# Patient Record
Sex: Female | Born: 1970 | Race: White | Hispanic: No | Marital: Single | Smoking: Current some day smoker
Health system: Southern US, Community
[De-identification: ages and names within clinical notes are randomized; demographics above are authoritative.]

## PROBLEM LIST (undated history)

## (undated) ENCOUNTER — Inpatient Hospital Stay (HOSPITAL_COMMUNITY): Payer: Self-pay

## (undated) DIAGNOSIS — J45909 Unspecified asthma, uncomplicated: Secondary | ICD-10-CM

## (undated) DIAGNOSIS — F429 Obsessive-compulsive disorder, unspecified: Secondary | ICD-10-CM

## (undated) DIAGNOSIS — L709 Acne, unspecified: Secondary | ICD-10-CM

## (undated) DIAGNOSIS — F319 Bipolar disorder, unspecified: Secondary | ICD-10-CM

## (undated) HISTORY — DX: Obsessive-compulsive disorder, unspecified: F42.9

## (undated) HISTORY — DX: Acne, unspecified: L70.9

---

## 1999-08-15 ENCOUNTER — Inpatient Hospital Stay (HOSPITAL_COMMUNITY): Admission: EM | Admit: 1999-08-15 | Discharge: 1999-08-16 | Payer: Self-pay | Admitting: *Deleted

## 2000-06-12 ENCOUNTER — Encounter: Payer: Self-pay | Admitting: Internal Medicine

## 2000-06-12 ENCOUNTER — Ambulatory Visit (HOSPITAL_COMMUNITY): Admission: RE | Admit: 2000-06-12 | Discharge: 2000-06-12 | Payer: Self-pay | Admitting: Internal Medicine

## 2001-03-22 ENCOUNTER — Emergency Department (HOSPITAL_COMMUNITY): Admission: EM | Admit: 2001-03-22 | Discharge: 2001-03-22 | Payer: Self-pay | Admitting: *Deleted

## 2001-08-14 ENCOUNTER — Emergency Department (HOSPITAL_COMMUNITY): Admission: EM | Admit: 2001-08-14 | Discharge: 2001-08-14 | Payer: Self-pay | Admitting: Internal Medicine

## 2001-08-14 ENCOUNTER — Encounter: Payer: Self-pay | Admitting: Internal Medicine

## 2001-08-21 ENCOUNTER — Emergency Department (HOSPITAL_COMMUNITY): Admission: EM | Admit: 2001-08-21 | Discharge: 2001-08-22 | Payer: Self-pay | Admitting: Emergency Medicine

## 2001-10-19 ENCOUNTER — Emergency Department (HOSPITAL_COMMUNITY): Admission: EM | Admit: 2001-10-19 | Discharge: 2001-10-19 | Payer: Self-pay | Admitting: *Deleted

## 2001-10-19 ENCOUNTER — Encounter: Payer: Self-pay | Admitting: *Deleted

## 2001-11-20 ENCOUNTER — Emergency Department (HOSPITAL_COMMUNITY): Admission: EM | Admit: 2001-11-20 | Discharge: 2001-11-20 | Payer: Self-pay | Admitting: Emergency Medicine

## 2003-10-16 ENCOUNTER — Emergency Department (HOSPITAL_COMMUNITY): Admission: EM | Admit: 2003-10-16 | Discharge: 2003-10-16 | Payer: Self-pay | Admitting: Emergency Medicine

## 2004-01-01 ENCOUNTER — Emergency Department (HOSPITAL_COMMUNITY): Admission: EM | Admit: 2004-01-01 | Discharge: 2004-01-01 | Payer: Self-pay | Admitting: Emergency Medicine

## 2004-04-17 ENCOUNTER — Emergency Department (HOSPITAL_COMMUNITY): Admission: EM | Admit: 2004-04-17 | Discharge: 2004-04-17 | Payer: Self-pay | Admitting: Emergency Medicine

## 2004-08-15 ENCOUNTER — Ambulatory Visit: Payer: Self-pay | Admitting: Occupational Therapy

## 2007-05-23 ENCOUNTER — Emergency Department (HOSPITAL_COMMUNITY): Admission: EM | Admit: 2007-05-23 | Discharge: 2007-05-23 | Payer: Self-pay | Admitting: Emergency Medicine

## 2010-05-09 ENCOUNTER — Other Ambulatory Visit (HOSPITAL_COMMUNITY)
Admission: RE | Admit: 2010-05-09 | Discharge: 2010-05-09 | Disposition: A | Discharge status: Home / Self Care | Payer: Medicare Other | Source: Ambulatory Visit | Attending: Obstetrics and Gynecology | Admitting: Obstetrics and Gynecology

## 2010-05-09 ENCOUNTER — Other Ambulatory Visit: Payer: Self-pay | Admitting: Obstetrics and Gynecology

## 2010-05-09 DIAGNOSIS — Z124 Encounter for screening for malignant neoplasm of cervix: Secondary | ICD-10-CM | POA: Insufficient documentation

## 2010-07-13 NOTE — Discharge Summary (Signed)
[facility]  Patient:    Dominique Ferguson, Dominique Ferguson                      MRN: [id number] Adm. Date:  [id number] Disc. Date: [id number] Attending:  Otilio Saber Dictator:   Johnella Moloney, NP                           Discharge Summary  HISTORY OF PRESENT ILLNESS:  Dominique Ferguson is a 40 year old separated white female admitted on voluntary basis from [facility] status post suicidal attempt via overdose on approximately 25-50 Xanax.  Patient states her primary precipitant for this overdose was conflict with her boyfriends wife from whom he is getting a divorce.  She has been experiencing increased job stress.  Until recently, she has been out on Workers Comp and has had to go back to work on Monday.  She was informed at that time she would probably lose her job.  She reports taking approximately 20-50 0.5 mg Xanax in an impulsive gesture to end her life.  She is still verbalizing suicidal ideation but is able to contract for safety.  Reported decreased sleep of approximately 3-4 hours a night for safety.  She has decreased appetite with approximately 6 pound weight loss in one week.  Has no energy and decreased concentration, also has a history of panic attacks and is having approximately two of these a day.  She presents very depressed, but she is as noted contracting for safety. She denies any homicidal ideation or harm towards others.  She experiences mood lability with often angry outbursts.  PAST PSYCHIATRIC HISTORY:  The patient is a client of [facility].  She denies any previous inpatient treatment.  She reports lengthy trials of multiple drugs. She does have a history of auditory and visual hallucinations.  She is not experiencing that at this time.  Reports four other suicidal gestures via overdose and she has made superficial cuts to her arm in the past.  She has last done this on August 14, 1999.  Her therapist is Sammuel Bailiff, but she acknowledges she is noncompliant with most of her [facility] mental health follow up.  PAST MEDICAL HISTORY:  Primary care physician Dr. Olivia Mackie.  Medical problems include asthma and hypertension.  Admission medications:  Xanax 0.5 mg b.i.d. She states she has been on some sort of benzodiazepine since the age of 16. Albuterol metered dose inhaler 2 puffs p.r.n.  She is taking tiamterene/hctz 37.5/25 mg q.d., Celexa 40 mg q.d.  She has been noncompliant with this. Last dose was one week ago.  Birth control pills, and she is been noncompliant with these.  DRUG ALLERGIES:  No known drug allergies.  PHYSICAL EXAMINATION:  Physical examination and clearance was performed at [facility].  No apparent sequelae to overdose.  She was found to be hypokalemic and according to reports received 80 mEq of potassium in two liters of normal saline.  Urine drug screen was positive for benzodiazepines. She had a negative pregnancy test.  She does present with superficial scratches on her left forearm, self inflicted.  Vital signs were stable and have been stable since admission.  MENTAL STATUS EXAMINATION:  On admission, overweight white female who is cooperative.  Speech normal rate and tone and relevant.  Mood is depressed. Affect sad and blunted.  Thought processes:  She is positive for suicide thoughts, contracts for  safety on the unit, denies homicidal ideation. History of obsessive-compulsive traits.  She states she obsessively counts things, need to be in even numbers.  She describes herself as having obsessive thinking, feels like people are frequently talking about her to the point where she has been unable to enter the house, stores and other places. Cognitive function appears to be intact.  Impaired insight and judgment.  No evidence that she is experiencing any psychotic symptoms or attending to internal stimuli.  ADMITTING DIAGNOSES: Axis I:    Major  depression with suicide attempt. Axis II:   Borderline personality disorder, obsessive-compulsive            disorder. Axis III:  Status post overdose on Xanax, asthma, hypertension. Axis IV:   Severe, related to problems with primary support group,            occupational problems. Axis V:    Current global assessment of function 25, highest last year 65.  LABORATORY DATA:  Reported under physical examination.  HOSPITAL COURSE:  The patient was admitted to [facility] unit for treatment of her depression after her overdose.  When she was admitted, it was decided that we would try her on Neurontin 100 mg p.o. q.i.d. and Ambien 10 mg p.o. q.h.s. for sleep.  Also Albuterol MDI 2 puffs q.4-6h. p.r.n.  We decided to change her to Klonopin 0.25 mg p.o. b.i.d.  While at the hospital, she reported feeling better today.  Mood and affect are euthymic. She denies feeling anxious and denied any suicidal thoughts, says she feels able to cope with issues at home.  Sleeping and eating well.  Tolerating the Neurontin.  We did decide to change her to Klonopin to help prevent any withdrawal symptoms from Xanax.  It was decided she could be safely managed on an outpatient basis and that she could be discharged today.  CONDITION ON DISCHARGE:  Patient is discharged in improved condition, with improvement in mood, sleep, appetite, alleviation of suicidal or homicidal ideation, and improvement in energy.  DISPOSITION:  The patient is to be discharged home today.  FOLLOW UP:  She is follow up with the [facility].  DISCHARGE MEDICATIONS: 1. Neurontin 100 mg q.i.d. 2. Klonopin 0.25 mg b.i.d.  FINAL DIAGNOSIS: Axis I:    Depressive disorder not otherwise specified. Axis II:   Borderline personality disorder, obsessive-compulsive            disorder. Axis III:  Status post overdose on Xanax, asthma, hypertension. Axis IV:   Moderate, related to problems with  primary support group. Axis V:    Current global assessment of function 55, highest last year 65. DD:  10/02/99 TD:  10/04/99 Job: [id number]
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
## 2011-06-07 ENCOUNTER — Inpatient Hospital Stay (HOSPITAL_COMMUNITY)
Admission: AD | Admit: 2011-06-07 | Discharge: 2011-06-07 | Disposition: A | Discharge status: Home / Self Care | Payer: Medicare Other | Source: Ambulatory Visit | Attending: Obstetrics and Gynecology | Admitting: Obstetrics and Gynecology

## 2011-06-07 ENCOUNTER — Encounter (HOSPITAL_COMMUNITY): Payer: Self-pay | Admitting: Obstetrics and Gynecology

## 2011-06-07 DIAGNOSIS — Z6791 Unspecified blood type, Rh negative: Secondary | ICD-10-CM

## 2011-06-07 DIAGNOSIS — O26899 Other specified pregnancy related conditions, unspecified trimester: Secondary | ICD-10-CM

## 2011-06-07 DIAGNOSIS — O009 Unspecified ectopic pregnancy without intrauterine pregnancy: Secondary | ICD-10-CM

## 2011-06-07 DIAGNOSIS — O00109 Unspecified tubal pregnancy without intrauterine pregnancy: Secondary | ICD-10-CM | POA: Insufficient documentation

## 2011-06-07 HISTORY — DX: Obsessive-compulsive disorder, unspecified: F42.9

## 2011-06-07 HISTORY — DX: Bipolar disorder, unspecified: F31.9

## 2011-06-07 LAB — HCG, QUANTITATIVE, PREGNANCY: hCG, Beta Chain, Quant, S: 22 m[IU]/mL — ABNORMAL HIGH (ref <5)

## 2011-06-07 LAB — CREATININE, SERUM
Creatinine, Ser: 0.69 mg/dL (ref 0.50–1.10)
GFR calc Af Amer: >90 mL/min (ref >90)
GFR calc non Af Amer: >90 mL/min (ref >90)

## 2011-06-07 LAB — CBC
HCT: 36.3 % (ref 36.0–46.0)
Hemoglobin: 12.2 g/dL (ref 12.0–15.0)
MCHC: 33.6 g/dL (ref 30.0–36.0)
RBC: 4.25 MIL/uL (ref 3.87–5.11)

## 2011-06-07 LAB — AST: AST: 15 U/L (ref 0–37)

## 2011-06-07 LAB — ABO/RH: ABO/RH(D): A NEG

## 2011-06-07 LAB — BUN: BUN: 15 mg/dL (ref 6–23)

## 2011-06-07 MED ORDER — METHOTREXATE INJECTION FOR WOMEN'S HOSPITAL
50.0000 mg/m2 | Freq: Once | INTRAMUSCULAR | Status: AC
  Administered 2011-06-07: 100 mg via INTRAMUSCULAR
  Filled 2011-06-07: qty 2

## 2011-06-07 MED ORDER — RHO D IMMUNE GLOBULIN 1500 UNIT/2ML IJ SOLN
300.0000 ug | Freq: Once | INTRAMUSCULAR | Status: AC
  Administered 2011-06-07: 300 ug via INTRAMUSCULAR

## 2011-06-07 NOTE — MAU Provider Note (Signed)
Dominique Ferguson is a 41 yr old G5 P2022 sent to [facility] MAU for Methotrexate therapy for chronic Ectopic.  She has been followed at [facility] during the current pregnancy which has been notable for atypical qHCG's and non-diagnostic ultrasound. QHCG's have been as follows: 2/27       106    At [facility] 3/8           36.5 3/11         22.5 3/18         75.3 3.25         111.0 3.27           64.3 4/4             51.8 4/9             36.7 Ultrasound 3/27 was non-diagnostic of gestational site, and when seen yesterday in [facility], an ultrasound was again non-diagnostic with an empty uterus with thin symmetric endometrium 4.8 MM, no adnexal masses, but significant tenderness on the right with vag probe, no free fluid.   Blood type is A Negative by [employer]. Rhogam/ Rhophylac has not been administered.   Patient is referred for Methotrexate therapy.  Gyn History significant for Consideration of Tubal sterilization in 2012, with patient deciding against surgery due to concerns over surgery procedure/anesthesia.   Baseline history completed to aid in MAU care.  Patient care continued by [place] CNM

## 2011-06-07 NOTE — MAU Note (Signed)
Patient was given handout on methotrexate and ectopic pregnancy.

## 2011-06-07 NOTE — Discharge Instructions (Signed)
Ectopic Pregnancy An ectopic pregnancy is when the fertilized egg attaches (implants) outside the uterus. Most ectopic pregnancies occur in the fallopian tube. Rarely do ectopic pregnancies occur on the ovary, intestine, pelvis, or cervix. An ectopic pregnancy does not have the ability to develop into a normal, healthy baby.  A ruptured ectopic pregnancy is one in which the fallopian tube gets torn or bursts and results in internal bleeding. Often there is intense abdominal pain, and sometimes, vaginal bleeding. Having an ectopic pregnancy can be a life-threatening experience. If left untreated, this dangerous condition can lead to a blood transfusion, abdominal surgery, or even death. CAUSES  Damage to the fallopian tubes is the suspected cause in most ectopic pregnancies.  RISK FACTORS Depending on your circumstances, the amount of risk of having an ectopic pregnancy will vary. There are 3 categories that may help you identify whether you are potentially at risk. High Risk  You have gone through infertility treatment.   You have had a previous ectopic pregnancy.   You have had previous tubal surgery.   You have had previous surgery to have the fallopian tubes tied (tubal ligation).   You have tubal problems or diseases.   You have been exposed to DES. DES is a medicine that was used until 1971 and had effects on babies whose mothers took the medicine.   You become pregnant while using an intrauterine device (IUD) for birth control.  Moderate Risk  You have a history of infertility.   You have a history of a sexually transmitted infection (STI).   You have a history of pelvic inflammatory disease (PID).   You have scarring from endometriosis.   You have multiple sexual partners.   You smoke.  Low Risk  You have had previous pelvic surgery.   You use vaginal douching.   You became sexually active before 39 years of age.  SYMPTOMS  An ectopic pregnancy should be suspected  in anyone who has missed a period and has abdominal pain or bleeding.  You may experience normal pregnancy symptoms, such as:   Nausea.   Tiredness.   Breast tenderness.   Symptoms that are not normal include:   Pain with intercourse.   Irregular vaginal bleeding or spotting.   Cramping or pain on one side, or in the lower abdomen.   Fast heartbeat.   Passing out while having a bowel movement.   Symptoms of a ruptured ectopic pregnancy and internal bleeding may include:   Sudden, severe pain in the abdomen and pelvis.   Dizziness or fainting.   Pain in the shoulder area.  DIAGNOSIS  Tests that may be performed include:  A pregnancy test.   An ultrasound.   Testing the specific level of pregnancy hormone in the bloodstream.   Taking a sample of uterus tissue (dilation and curettage, D&C).   Surgery to perform a visual exam of the inside of the abdomen using a lighted tube (laparoscopy).  TREATMENT  An injection of methotrexate medicine may be given. This is given if:  The diagnosis is made early.   The fallopian tube has not ruptured.   You are considered to be a good candidate for the medicine.  Usually, pregnancy hormone blood levels are checked after methotrexate treatment. This is to be sure the medicine is effective. It may take 4 to 6 weeks for the pregnancy to be absorbed (though most pregnancies will be absorbed by 3 weeks). Surgical treatment may be needed. A lighted tube (laparoscope) is   used to remove the tubal pregnancy. If severe internal bleeding occurs, a cut (incision) may be made in the lower abdomen (laparotomy), and the ectopic pregnancy is removed. This stops the bleeding. Part of the fallopian tube, or the whole tube, may be removed as well (salpingectomy). After surgery, pregnancy hormone tests may be done to be sure there is no pregnancy tissue left. You may receive a RhoGAM shot if you are Rh negative and the father is Rh positive, or if you do  not know the Rh type of the father. This is to prevent problems with any future pregnancy. SEEK IMMEDIATE MEDICAL CARE IF:  You have any symptoms of an ectopic pregnancy. This is a medical emergency. Document Released: 03/21/2004 Document Revised: 01/31/2011 Document Reviewed: 10/18/2010 [employer] Patient Information 2012 [employer]  Methotrexate Treatment for an Ectopic Pregnancy An ectopic pregnancy is when the fertilized egg attaches (implants) outside the uterus. Most ectopic pregnancies occur in the fallopian tube. Rarely do ectopic pregnancies occur on the ovary, intestine, pelvis, or cervix. An ectopic pregnancy does not have the ability to develop into a normal, healthy baby. Having an ectopic pregnancy can be a life-threatening experience. However, if the ectopic pregnancy is found early enough, it can be treated with a medicine. This medicine is called methotrexate. Methotrexate works by stopping the pregnancy from growing. It helps the body absorb the pregnancy tissue over a 2 to 6 week period (though most pregnancies will be absorbed by 3 weeks).  If methotrexate is successful, there is a good chance that the fallopian tube may be saved. Regardless of whether the fallopian tube is saved, a mother who has had an ectopic pregnancy is at a much higher risk of having another ectopic occur in future pregnancies. One serious concern is the potential for the fallopian tube to tear (rupture). If it does, emergency surgery is needed to remove the pregnancy, and methotrexate cannot be used. The ideal patient for methotrexate is a person who is:   Not bleeding internally.   Has no severe or persistent abdominal pain.   Is committed to following through with lab tests and appointments until the ectopic has absorbed.   Is healthy and has normal liver and kidney functions on evaluation.  Methotrexate should not be given to women who:  Are breastfeeding.   Have a normal pregnancy  (intrauterine pregnancy).   Have liver, lung, or kidney disease.   Have blood problems.   Are allergic to methotrexate.   Have peptic ulcers.   Have an ectopic pregnancy larger than 1 inches (3.5 cm) or one that has fetal heartbeats. This is a rule that is followed most of the time (relative contraindication).  BEFORE THE TREATMENT Before giving the medicine:  Liver tests, kidney tests, and a complete blood test are performed.   Blood tests are performed to measure the pregnancy hormone levels and to determine the mother's blood type.   If the woman is Rh negative, and the father is Rh positive or his Rh type is not known, a RhoGAM shot is given.  TREATMENT  There are 2 methods that your caregiver may use to prescribe methotrexate. One method involves a single dose or injection of the medicine. Another method involves a series of doses. This method involves several injections.  AFTER THE TREATMENT Blood tests will be taken for several weeks to check the pregnancy hormone levels. The blood tests are performed until there is no more pregnancy hormone detected in the blood. There is still   of the ectopic pregnancy rupturing while using the methotrexate. There are also side effects of methotrexate, which include:   Nausea and vomiting.   Mouth sores.   Diarrhea.   Rash.   Dizziness.   Increased abdominal pain.   Increased vaginal bleeding or spotting.   Pneumonia.   Failed treatment.   Hair loss. This is rare and reversible.  On very rare occasions, the medicine may affect your blood counts, liver, kidney, bone marrow, or hormone levels. If this happens, your caregiver will want to perform further evaluations. Document Released: 02/05/2001 Document Revised: 01/31/2011 Document Reviewed: 10/18/2010 [facility] Patient Information 2012 [place], [place].RhoGAM When you are pregnant, you will have a blood test to find out what blood type you are. When a pregnant women is Rh-negative, it  means that she does not have the Rh factor or antigen (a specific protein in red blood cells). The father of the baby will also be tested for his blood type. If he is Rh-negative also, the baby will be Rh-negative. In this case, no Rh problems will develop during the pregnancy, and RhoGAM will not be needed. If the father is Rh-positive, it is possible that the baby will be Rh-positive, which is incompatible with the mother's Rh-negative blood. In this case, the mother's blood will react as though she is allergic to the baby's blood. Her body will produce antibodies to destroy the baby's red blood cells. This causes anemia, brain damage, and even death of the baby. RhoGAM (anti-Rh, anti-D immunoglobulin) is a vaccine or immunization produced from human plasma. It is given to Rh-negative women who have Rh-positive babies, to protect the babies of future pregnancies. Firstborn infants are usually not affected, because it takes time for the mother's body to develop the antibodies. About 15% of people are Rh-negative. CAUSES  In a normal pregnancy, small numbers of the baby's red blood cells get into the mother's bloodstream. When the baby is Rh-positive, the mother's immune system (body system that fights infections and illness) recognizes that the blood is different from her own. The mother's body tries to fight it off and destroy it, like fighting off an infection or illness. The mother's immune system forms antibodies against the baby's blood. These antibodies are passed through the placenta to the baby, and they begin to destroy the baby's red blood cells. The baby becomes anemic (lacking enough red blood cells). The Rh problem does not usually affect the first baby. If the mother does not get RhoGAM, each of the following pregnancies gets more dangerous, if the future babies are Rh-positive. That is because the mother's immune system develops more antibodies each time. It gets stronger and increases the number  of antibodies against the baby's red blood cells with each future pregnancy. TREATMENT  RhoGAM is a solution containing small amounts of Rh antibodies. It prevents the development of antibodies against Rh-positive blood. It is injected into the mother at around 7 months of pregnancy, if she is Rh-negative. It is injected again within 72 hours after the baby is born, if the baby is Rh-positive. It is also given anytime during the pregnancy, if uterine bleeding develops. RhoGAM does not hurt the baby. It has few and minor side effects, if any, to the mother. A MICRhoGAM (smaller dose) is given in case of a miscarriage, elective abortion, or tubal pregnancy (pregnancy outside the uterus) if it is before 13 weeks of the pregnancy. RhoGAM should be given in the event of:  Miscarriage.   Threatened miscarriage,  if there is bleeding.   Induced abortion.   Tubal (ectopic) pregnancy.   Any bleeding during the pregnancy.   Taking an amniotic fluid sample (amniocentesis).   Blood sampling.   Getting the wrong blood transfusion (positive blood in an Rh-negative woman).   Moving the baby from breech to normal position (external version).   Taking a placenta tissue sample (chorionic villus sampling).   Taking a blood sample from the umbilical cord (cordocentesis).   Mass of cysts in the uterus, instead of a fetus (hydatidiform mole).   Failure to get RhoGAM when necessary.   Pregnancy lasting past the due date, when the last RhoGAM shot was given 12 weeks ago.   Injury (trauma) to the abdomen.  RhoGAM should be given even if the Rh-negative mother has a tubal ligation (female sterilization, "tied tubes"). This is because some women will later decide to have surgery to re-open the tubes, in order to get pregnant again. Or the tubal ligation might not work. RhoGAM is given after the delivery of an Rh-positive baby to an Rh-negative mother. It only protects the baby in the next pregnancy. RhoGAM  must be given after each delivery of an Rh-positive baby born to an Rh-negative mother. RhoGAM cannot help a pregnant Rh-negative mother if she has already been sensitized with Rh-positive antibodies. RhoGAM is not known to transmit hepatitis or other infectious diseases. Your caregiver can discuss the recommendations and uses of RhoGAM with you. You should not receive RhoGAM if you had an allergic reaction to it in the past. Document Released: 08/03/2001 Document Revised: 01/31/2011 Document Reviewed: 02/21/2009 [facility] Patient Information 2012 [place], [place].

## 2011-06-07 NOTE — MAU Provider Note (Signed)
Dominique Ferguson is a 41 yr old G5 P2022 sent to [facility] MAU for Methotrexate therapy for chronic Ectopic.  She has been followed at [facility] during the current pregnancy which has been notable for atypical qHCG's and non-diagnostic ultrasound. QHCG's have been as follows: 2/27       106    At [facility] 3/8           36.5 3/11         22.5 3/18         75.3 3.25         111.0 3.27           64.3 4/4             51.8 4/9             36.7 Ultrasound 3/27 was non-diagnostic of gestational site, and when seen yesterday in [facility], an ultrasound was again non-diagnostic with an empty uterus with thin symmetric endometrium 4.8 MM, no adnexal masses, but significant tenderness on the right with vag probe, no free fluid.   Blood type is A Negative by [employer]. Rhogam/ Rhophylac has not been administered.   Patient is referred for Methotrexate therapy.  Gyn History significant for Consideration of Tubal sterilization in 2012, with patient deciding against surgery due to concerns over surgery procedure/anesthesia.   First contact w/ Patient 06/07/11 @ 1010.   Results for orders placed during the hospital encounter of 06/07/11 (from the past 24 hour(s))  HCG, QUANTITATIVE, PREGNANCY     Status: Abnormal   Collection Time   06/07/11  8:58 AM      Component Value Range   hCG, Beta Chain, Quant, S 22 (*) <5 (mIU/mL)  CBC     Status: Normal   Collection Time   06/07/11  8:58 AM      Component Value Range   WBC 9.0  4.0 - 10.5 (K/uL)   RBC 4.25  3.87 - 5.11 (MIL/uL)   Hemoglobin 12.2  12.0 - 15.0 (g/dL)   HCT 16.1  09.6 - 04.5 (%)   MCV 85.4  78.0 - 100.0 (fL)   MCH 28.7  26.0 - 34.0 (pg)   MCHC 33.6  30.0 - 36.0 (g/dL)   RDW 40.9  81.1 - 91.4 (%)   Platelets 194  150 - 400 (K/uL)  AST     Status: Normal   Collection Time   06/07/11  8:58 AM      Component Value Range   AST 15  0 - 37 (U/L)  BUN     Status: Normal   Collection Time   06/07/11  8:58 AM        Component Value Range   BUN 15  6 - 23 (mg/dL)  CREATININE, SERUM     Status: Normal   Collection Time   06/07/11  8:58 AM      Component Value Range   Creatinine, Ser 0.69  0.50 - 1.10 (mg/dL)   GFR calc non Af Amer >90  >90 (mL/min)   GFR calc Af Amer >90  >90 (mL/min)  ABO/RH     Status: Normal   Collection Time   06/07/11  8:58 AM      Component Value Range   ABO/RH(D) A NEG    RH IG WORKUP     Status: Normal (Preliminary result)   Collection Time   06/07/11  9:00 AM      Component Value  Range   Gestational Age(Wks) 22.3     ABO/RH(D) A NEG     Antibody Screen NEG     Fetal Screen NEG     Unit Number [id number]     Blood Component Type RHIG     Unit division 00     Status of Unit ISSUED     Transfusion Status OK TO TRANSFUSE     Received Rhophylac and MTX  1. Ectopic pregnancy   2. Rh negative state in antepartum period    D/C home Follow-up Information    Follow up with [facility] on 06/10/2011. (or MAU as needed if symptoms worsen)    Contact information:   [address] [place] [address]        Ectopic precautions No Ibuprofen, Folic Acid or PNV  Katrinka Blazing, Marca Gadsby 06/07/2011 11:32 AM

## 2011-06-08 LAB — RH IG WORKUP (INCLUDES ABO/RH)
Antibody Screen: NEGATIVE
Unit division: 0

## 2011-06-08 NOTE — MAU Provider Note (Signed)
I agree with this case management and documentation.

## 2012-07-31 ENCOUNTER — Encounter: Payer: Self-pay | Admitting: *Deleted

## 2012-08-03 ENCOUNTER — Encounter: Payer: Self-pay | Admitting: Obstetrics & Gynecology

## 2012-08-03 ENCOUNTER — Ambulatory Visit (INDEPENDENT_AMBULATORY_CARE_PROVIDER_SITE_OTHER): Payer: Medicare Other | Admitting: Obstetrics & Gynecology

## 2012-08-03 VITALS — BP 130/80 | Ht 63.0 in | Wt 198.0 lb

## 2012-08-03 DIAGNOSIS — Z3049 Encounter for surveillance of other contraceptives: Secondary | ICD-10-CM

## 2012-08-03 DIAGNOSIS — Z3202 Encounter for pregnancy test, result negative: Secondary | ICD-10-CM

## 2012-08-03 LAB — POCT URINE PREGNANCY: Preg Test, Ur: NEGATIVE

## 2012-08-03 MED ORDER — MEDROXYPROGESTERONE ACETATE 150 MG/ML IM SUSP
150.0000 mg | Freq: Once | INTRAMUSCULAR | Status: AC
  Administered 2012-08-03: 150 mg via INTRAMUSCULAR

## 2012-10-27 ENCOUNTER — Ambulatory Visit: Payer: Medicare Other

## 2012-10-28 ENCOUNTER — Encounter: Payer: Self-pay | Admitting: Adult Health

## 2012-10-28 ENCOUNTER — Ambulatory Visit (INDEPENDENT_AMBULATORY_CARE_PROVIDER_SITE_OTHER): Payer: Medicare Other | Admitting: Adult Health

## 2012-10-28 VITALS — BP 118/64 | Ht 63.0 in | Wt 203.0 lb

## 2012-10-28 DIAGNOSIS — Z3202 Encounter for pregnancy test, result negative: Secondary | ICD-10-CM

## 2012-10-28 DIAGNOSIS — Z309 Encounter for contraceptive management, unspecified: Secondary | ICD-10-CM

## 2012-10-28 DIAGNOSIS — Z3049 Encounter for surveillance of other contraceptives: Secondary | ICD-10-CM

## 2012-10-28 LAB — POCT URINE PREGNANCY: Preg Test, Ur: NEGATIVE

## 2012-10-28 MED ORDER — MEDROXYPROGESTERONE ACETATE 150 MG/ML IM SUSP
150.0000 mg | Freq: Once | INTRAMUSCULAR | Status: AC
  Administered 2012-10-28: 150 mg via INTRAMUSCULAR

## 2013-01-20 ENCOUNTER — Ambulatory Visit (INDEPENDENT_AMBULATORY_CARE_PROVIDER_SITE_OTHER): Payer: Medicare Other | Admitting: Obstetrics & Gynecology

## 2013-01-20 ENCOUNTER — Encounter: Payer: Self-pay | Admitting: Obstetrics & Gynecology

## 2013-01-20 VITALS — BP 130/78 | Ht 63.0 in | Wt 206.0 lb

## 2013-01-20 DIAGNOSIS — Z3202 Encounter for pregnancy test, result negative: Secondary | ICD-10-CM

## 2013-01-20 DIAGNOSIS — Z3049 Encounter for surveillance of other contraceptives: Secondary | ICD-10-CM

## 2013-01-20 MED ORDER — MEDROXYPROGESTERONE ACETATE 150 MG/ML IM SUSP
150.0000 mg | Freq: Once | INTRAMUSCULAR | Status: AC
  Administered 2013-01-20: 150 mg via INTRAMUSCULAR

## 2013-01-20 NOTE — Progress Notes (Signed)
Pt here for Depo shot. No complaints at this time. 

## 2013-04-08 ENCOUNTER — Other Ambulatory Visit: Payer: Self-pay | Admitting: Adult Health

## 2013-04-13 ENCOUNTER — Ambulatory Visit: Payer: Medicare Other

## 2013-04-16 ENCOUNTER — Ambulatory Visit (INDEPENDENT_AMBULATORY_CARE_PROVIDER_SITE_OTHER): Payer: Medicare Other | Admitting: Obstetrics & Gynecology

## 2013-04-16 ENCOUNTER — Encounter: Payer: Self-pay | Admitting: Obstetrics & Gynecology

## 2013-04-16 VITALS — BP 134/58 | Ht 63.0 in | Wt 207.0 lb

## 2013-04-16 DIAGNOSIS — Z309 Encounter for contraceptive management, unspecified: Secondary | ICD-10-CM

## 2013-04-16 DIAGNOSIS — Z3202 Encounter for pregnancy test, result negative: Secondary | ICD-10-CM

## 2013-04-16 DIAGNOSIS — Z3049 Encounter for surveillance of other contraceptives: Secondary | ICD-10-CM

## 2013-04-16 LAB — POCT URINE PREGNANCY: PREG TEST UR: NEGATIVE

## 2013-04-16 MED ORDER — MEDROXYPROGESTERONE ACETATE 150 MG/ML IM SUSP
150.0000 mg | Freq: Once | INTRAMUSCULAR | Status: AC
  Administered 2013-04-16: 150 mg via INTRAMUSCULAR

## 2013-04-16 NOTE — Progress Notes (Signed)
Pt here for Depo shot. No complaints at this time. To return in 12 weeks for next shot. JSY 

## 2013-05-20 ENCOUNTER — Encounter: Payer: Self-pay | Admitting: Neurology

## 2013-05-21 ENCOUNTER — Encounter: Payer: Medicare Other | Admitting: Neurology

## 2013-05-21 NOTE — Progress Notes (Deleted)
[facility]    Provider:  Dr Hosie PoissonSumner Referring Provider: Biagio Quintobertson, Anthony T, P* Primary Care Physician:  PROVIDER NOT IN SYSTEM  CC:  Migraine and muscle spasms  HPI:  Dominique Ferguson is a 43 y.o. female here as a referral from Dr. Merilynn Finlandobertson for referral of migraines and muscle spasms  Has chronic history of migraines, currently taking Imitrex 25mg  prn. Migraine frequency has been increasing, currently occuring twice a week. She does get some benefit from the Imitrex but feels it does not completely relieve the migraine. Currently using 8-9 pils a month.   Also notes episodes of muscle spasms which began around 6 months ago and have been getting progressively worse. Describes them as generalized, pain followed by electric shock radiating from original site of the pain. No tingling or weakness  Reviewed notes, labs and imaging from outside physicians, which showed *** Review of Systems: Out of a complete 14 system review, the patient complains of only the following symptoms, and all other reviewed systems are negative. ***  History   Social History  . Marital Status: Single    Spouse Name: N/A    Number of Children: N/A  . Years of Education: N/A   Occupational History  . Not on file.   Social History Main Topics  . Smoking status: Former Smoker    Types: Cigarettes  . Smokeless tobacco: Never Used  . Alcohol Use: No  . Drug Use: No  . Sexual Activity: Yes    Birth Control/ Protection: Injection   Other Topics Concern  . Not on file   Social History Narrative  . No narrative on file    Family History  Problem Relation Age of Onset  . Heart disease Other   . Hypertension Father   . Cancer Other   . Diabetes Other     grandfather  . Seizures Other   . Mental illness Other   . Congestive Heart Failure      grandmother    Past Medical History  Diagnosis Date  . Bipolar 1 disorder   . Obsessive compulsive disorder   . Bipolar 1  disorder   . OCD (obsessive compulsive disorder)   . Acne     Past Surgical History  Procedure Laterality Date  . No past surgeries      Current Outpatient Prescriptions  Medication Sig Dispense Refill  . ALBUTEROL IN Inhale into the lungs as needed.      . clonazePAM (KLONOPIN) 1 MG tablet Take 1 mg by mouth 4 (four) times daily.      . divalproex (DEPAKOTE) 500 MG DR tablet Take 500 mg by mouth 3 (three) times daily. 2 at bedtime.      . Ipratropium-Albuterol (COMBIVENT IN) Inhale into the lungs daily.      . medroxyPROGESTERone (DEPO-PROVERA) 150 MG/ML injection FOR INJECT AT MD OFFICE EVERY 3 MONTHS  1 mL  4  . QUEtiapine (SEROQUEL) 300 MG tablet Take 300 mg by mouth at bedtime. 2 at bedtime.      Marland Kitchen. QUEtiapine (SEROQUEL) 400 MG tablet Take 400 mg by mouth 2 (two) times daily.      . SUMAtriptan Succinate (IMITREX PO) Take by mouth daily as needed.      . tolterodine (DETROL LA) 2 MG 24 hr capsule Take 2 mg by mouth daily.      . trazodone (DESYREL) 300 MG tablet Take 300 mg by mouth 3 (three) times daily.       No  current facility-administered medications for this visit.    Allergies as of 05/21/2013  . (No Known Allergies)    Vitals: There were no vitals taken for this visit. Last Weight:  Wt Readings from Last 1 Encounters:  04/16/13 207 lb (93.895 kg)   Last Height:   Ht Readings from Last 1 Encounters:  04/16/13 5\' 3"  (1.6 m)     Physical exam: Exam: Gen: NAD, conversant Eyes: anicteric sclerae, moist conjunctivae HENT: Atraumatic, oropharynx clear Neck: Trachea midline; supple,  Lungs: CTA, no wheezing, rales, rhonic                          CV: RRR, no MRG Abdomen: Soft, non-tender;  Extremities: No peripheral edema  Skin: Normal temperature, no rash,  Psych: Appropriate affect, pleasant  Neuro: MS: AA&Ox3, appropriately interactive, normal affect   Attention: WORLD backwards  Speech: fluent w/o paraphasic error  Memory: good recent and remote  recall  CN: PERRL, EOMI no nystagmus, no ptosis, sensation intact to LT V1-V3 bilat, face symmetric, no weakness, hearing grossly intact, palate elevates symmetrically, shoulder shrug 5/5 bilat,  tongue protrudes midline, no fasiculations noted.  Motor: normal bulk and tone Strength: 5/5  In all extremities  Coord: rapid alternating and point-to-point (FNF, HTS) movements intact.  Reflexes: symmetrical, bilat downgoing toes  Sens: LT intact in all extremities  Gait: posture, stance, stride and arm-swing normal. Tandem gait intact. Able to walk on heels and toes. Romberg absent.   Assessment:  After physical and neurologic examination, review of laboratory studies, imaging, neurophysiology testing and pre-existing records, assessment will be reviewed on the problem list.  Plan:  Treatment plan and additional workup will be reviewed under Problem List.    Elspeth Cho, DO  [facility] [address] [place], [place] [address]  Phone [phone] Fax [phone]  This encounter was created in error - please disregard.

## 2013-05-22 NOTE — Progress Notes (Signed)
Please disregard. Visit created in error as patient was a no show.

## 2013-06-08 ENCOUNTER — Encounter (INDEPENDENT_AMBULATORY_CARE_PROVIDER_SITE_OTHER): Payer: Self-pay

## 2013-06-08 ENCOUNTER — Encounter: Payer: Self-pay | Admitting: Neurology

## 2013-06-08 ENCOUNTER — Telehealth: Payer: Self-pay | Admitting: Neurology

## 2013-06-08 ENCOUNTER — Ambulatory Visit (INDEPENDENT_AMBULATORY_CARE_PROVIDER_SITE_OTHER): Payer: Medicare Other | Admitting: Neurology

## 2013-06-08 VITALS — BP 148/73 | HR 109 | Ht 64.75 in | Wt 208.0 lb

## 2013-06-08 DIAGNOSIS — R51 Headache: Secondary | ICD-10-CM

## 2013-06-08 DIAGNOSIS — R519 Headache, unspecified: Secondary | ICD-10-CM | POA: Insufficient documentation

## 2013-06-08 MED ORDER — GABAPENTIN 300 MG PO CAPS: ORAL_CAPSULE | ORAL | Status: DC

## 2013-06-08 NOTE — Patient Instructions (Signed)
Overall you are doing fairly well but I do want to suggest a few things today:   Remember to drink plenty of fluid, eat healthy meals and do not skip any meals. Try to eat protein with a every meal and eat a healthy snack such as fruit or nuts in between meals. Try to keep a regular sleep-wake schedule and try to exercise daily, particularly in the form of walking, 20-30 minutes a day, if you can.   As far as your medications are concerned, I would like to suggest the following: 1)Please increase the gabapentin using the following schedule: -week one increase the evening dose to 600mg  -week two, increase the afternoon and evening dose to 600mg  -week three, increase all 3 doses to 600mg  so you are taking 600mg  three times a day  Consider discussing with your psychiatrist if he is ok with slightly decreasing your seroquel dose, this high dose can lead to headaches  Please schedule an EMG/NCS when you check out  We will follow up once your workup is completed. Please call us with any interim questions, concerns, problems, updates or refill requests.   My clinical assistant and will answer any of your questions and relay your messages to me and also relay most of my messages to you.   Our phone number is [phone]. We also have an after hours call service for urgent matters and there is a physician on-call for urgent questions. For any emergencies you know to call 911 or go to the nearest emergency room

## 2013-06-08 NOTE — Progress Notes (Signed)
[facility]    Provider:  Dr Hosie PoissonSumner Referring Provider: Biagio Quintobertson, Anthony T, P* Primary Care Physician:  Lucius ConnOBERTSON, ANTHONY T, PA-C  CC:  Migraine headache and muscle spasms  HPI:  Dominique Ferguson is a 43 y.o. female here as a referral from Dr. Merilynn Finlandobertson for migraines and muscle spasms/diffuse pain.  She notes a diffuse chronic pain starting around 5 to 6 months ago. Notes a sharp stabbing pain located in one part of her body (varies location) and then will "shoot an electric shock" to the rest of her body, will happen repetitively to the rest of her body. Can last 30s to a few minutes. Can happen on its own or can be triggered by touch. Gets subjective weakness of her whole body, this occurs separate of those events. Feels the RUE is very weak from the elbow down, notes difficulty gripping things or opening bottles with her right hand.   Reports having headaches since age 89. Recently have progressed, currently occuring 2 to 3 times a week. Recently started on Imitrex by PCP, gives minimal benefit. Headaches can last hours to all day. Headache is typically right sided periorbital. Gets associated nausea and vomiting, + blurry vision.   Currently taking depakote and seroquel 400mg  during the day and 600mg  during the night (states she has been on this dose for years). Is followed by a psychiatrist. Her gabapentin was increased around 1 month ago, currently taking 300mg  TID, has given some benefit but has not resolved her symptoms.   Review of Systems: Out of a complete 14 system review, the patient complains of only the following symptoms, and all other reviewed systems are negative. + headache, confusion, snoring, depression, anxiety, too much sleep, weight gain, fatigue  History   Social History  . Marital Status: Single    Spouse Name: N/A    Number of Children: N/A  . Years of Education: N/A   Occupational History  . Not on file.   Social History Main  Topics  . Smoking status: Former Smoker    Types: Cigarettes  . Smokeless tobacco: Never Used  . Alcohol Use: No  . Drug Use: No  . Sexual Activity: Yes    Birth Control/ Protection: Injection   Other Topics Concern  . Not on file   Social History Narrative  . No narrative on file    Family History  Problem Relation Age of Onset  . Heart disease Other   . Hypertension Father   . Cancer Other   . Diabetes Other     grandfather  . Seizures Other   . Mental illness Other   . Congestive Heart Failure      grandmother    Past Medical History  Diagnosis Date  . Bipolar 1 disorder   . Obsessive compulsive disorder   . Bipolar 1 disorder   . OCD (obsessive compulsive disorder)   . Acne     Past Surgical History  Procedure Laterality Date  . No past surgeries      Current Outpatient Prescriptions  Medication Sig Dispense Refill  . traZODone (DESYREL) 100 MG tablet Take 100 mg by mouth at bedtime. Taking 3 pills at bedtime      . ALBUTEROL IN Inhale into the lungs as needed.      . clonazePAM (KLONOPIN) 1 MG tablet Take 1 mg by mouth 4 (four) times daily.      . divalproex (DEPAKOTE) 500 MG DR tablet Take 500 mg by mouth 3 (three)  times daily. 2 at bedtime.      . gabapentin (NEURONTIN) 300 MG capsule       . Ipratropium-Albuterol (COMBIVENT IN) Inhale into the lungs daily.      . medroxyPROGESTERone (DEPO-PROVERA) 150 MG/ML injection FOR INJECT AT MD OFFICE EVERY 3 MONTHS  1 mL  4  . mupirocin ointment (BACTROBAN) 2 %       . oxybutynin (DITROPAN) 5 MG tablet       . PROAIR HFA 108 (90 BASE) MCG/ACT inhaler       . QUEtiapine (SEROQUEL) 300 MG tablet Take 300 mg by mouth at bedtime. 2 at bedtime.      Marland Kitchen QUEtiapine (SEROQUEL) 400 MG tablet Take 400 mg by mouth 2 (two) times daily.      . SUMAtriptan Succinate (IMITREX PO) Take by mouth daily as needed.      . SYMBICORT 80-4.5 MCG/ACT inhaler       . tolterodine (DETROL LA) 2 MG 24 hr capsule Take 2 mg by mouth daily.        No current facility-administered medications for this visit.    Allergies as of 06/08/2013  . (No Known Allergies)    Vitals: There were no vitals taken for this visit. Last Weight:  Wt Readings from Last 1 Encounters:  04/16/13 207 lb (93.895 kg)   Last Height:   Ht Readings from Last 1 Encounters:  04/16/13 5\' 3"  (1.6 m)     Physical exam: Exam: Gen: NAD, conversant Eyes: anicteric sclerae, moist conjunctivae HENT: Atraumatic, oropharynx clear Neck: Trachea midline; supple,  Lungs: CTA, no wheezing, rales, rhonic                          CV: RRR, no MRG Abdomen: Soft, non-tender;  Extremities: No peripheral edema  Skin: Normal temperature, no rash,  Psych: Appropriate affect, pleasant  Neuro: MS: AA&Ox3, appropriately interactive, normal affect   Speech: fluent w/o paraphasic error  Memory: good recent and remote recall  CN: PERRL, EOMI no nystagmus, no ptosis, sensation intact to LT V1-V3 bilat, face symmetric, no weakness, hearing grossly intact, palate elevates symmetrically, shoulder shrug 5/5 bilat,  tongue protrudes midline, no fasiculations noted.  Motor: normal bulk and tone, tenderness to palpation of muscles throughout Strength: 5/5  In all extremities  Coord: rapid alternating and point-to-point (FNF, HTS) movements intact.  Reflexes: symmetrical, bilat downgoing toes  Sens: decreased LT in ulnar distribution from elbow down of RUE  Gait: posture, stance, stride and arm-swing normal.  Able to walk on heels and toes. Romberg absent.   Assessment:  After physical and neurologic examination, review of laboratory studies, imaging, neurophysiology testing and pre-existing records, assessment will be reviewed on the problem list.  Plan:  Treatment plan and additional workup will be reviewed under Problem List.  1)Headache 2)Paresthesias 3)Chronic pain 4)Bipolar disorder  42y/o gentleman presenting for initial evaluation of headache and  diffuse body pain. Headache is likely multifactorial, she does appear to have a history consistent with migraine without aura but I also suspect that the high dose of Seroquel may be contributing to her headache symptoms. Unclear etiology of the diffuse muscle aches. She does have some decreased sensation to LT in ulnar distribution of RUE but otherwise normal exam. Will check EMG/NCS, will then consider lab workup for possible cause. Patients age and symptoms raise possibility of MS but history not fully consistent with this, will hold off on imaging at this point.  Suspect that depression/anxiety may play a role in patients symptoms too.   Elspeth ChoPeter Daryll Spisak, DO  [facility] [address] [place], [place] [address]  Phone [phone] Fax [phone]

## 2013-06-08 NOTE — Telephone Encounter (Signed)
OV notes say: 1)Please increase the gabapentin using the following schedule:  -week one increase the evening dose to 600mg   -week two, increase the afternoon and evening dose to 600mg   -week three, increase all 3 doses to 600mg  so you are taking 600mg  three times a day Rx has been sent.  I called the patient back.  Got no answer.  Left message.

## 2013-06-08 NOTE — Telephone Encounter (Signed)
Patient at check out today states that she will need refill of Gabapentin soon since she said Dr. Hosie PoissonSumner increased her dosage today. Please call and advise patient.

## 2013-06-22 ENCOUNTER — Encounter (INDEPENDENT_AMBULATORY_CARE_PROVIDER_SITE_OTHER): Payer: Self-pay

## 2013-06-22 ENCOUNTER — Ambulatory Visit (INDEPENDENT_AMBULATORY_CARE_PROVIDER_SITE_OTHER): Payer: Medicare Other | Admitting: Neurology

## 2013-06-22 DIAGNOSIS — R209 Unspecified disturbances of skin sensation: Secondary | ICD-10-CM

## 2013-06-22 DIAGNOSIS — Z0289 Encounter for other administrative examinations: Secondary | ICD-10-CM

## 2013-06-22 DIAGNOSIS — M79609 Pain in unspecified limb: Secondary | ICD-10-CM

## 2013-06-22 NOTE — Procedures (Signed)
     HISTORY:  Dominique Ferguson is a 43 year old patient with a history of shocklike sensations that are present in the arms and legs of the last 6 months. She also describes some weakness in the right hand, dropping things out of the hand. Numbness is present in the arms and legs. The patient is being evaluated for possible neuropathy or a cervical radiculopathy.  NERVE CONDUCTION STUDIES:  Nerve conduction studies were performed on both upper extremities. The distal motor latencies and motor amplitudes for the median and ulnar nerves were within normal limits. The F wave latencies and nerve conduction velocities for these nerves were also normal. The sensory latencies for the median and ulnar nerves were normal.  Nerve conduction studies were performed on the right lower extremity. The distal motor latencies and motor amplitudes for the peroneal and posterior tibial nerves were within normal limits. The nerve conduction velocities for these nerves were also normal. The sensory latency for the peroneal nerve was within normal limits.   EMG STUDIES:  EMG study was performed on the right upper extremity:  The first dorsal interosseous muscle reveals 2 to 4 K units with full recruitment. No fibrillations or positive waves were noted. The abductor pollicis brevis muscle reveals 2 to 4 K units with full recruitment. No fibrillations or positive waves were noted. The extensor indicis proprius muscle reveals 1 to 3 K units with full recruitment. No fibrillations or positive waves were noted. The pronator teres muscle reveals 2 to 3 K units with full recruitment. No fibrillations or positive waves were noted. The biceps muscle reveals 1 to 2 K units with full recruitment. No fibrillations or positive waves were noted. The triceps muscle reveals 2 to 4 K units with full recruitment. No fibrillations or positive waves were noted. The anterior deltoid muscle reveals 2 to 3 K units with full recruitment.  No fibrillations or positive waves were noted. The cervical paraspinal muscles were tested at 2 levels. No abnormalities of insertional activity were seen at either level tested. There was good relaxation.  EMG study was performed on the right lower extremity:  The tibialis anterior muscle reveals 2 to 4K motor units with full recruitment. No fibrillations or positive waves were seen. The peroneus tertius muscle reveals 2 to 4K motor units with full recruitment. No fibrillations or positive waves were seen. The medial gastrocnemius muscle reveals 1 to 3K motor units with full recruitment. No fibrillations or positive waves were seen. The vastus lateralis muscle reveals 2 to 4K motor units with full recruitment. No fibrillations or positive waves were seen. The iliopsoas muscle reveals 2 to 4K motor units with full recruitment. No fibrillations or positive waves were seen. The biceps femoris muscle (long head) reveals 2 to 4K motor units with full recruitment. No fibrillations or positive waves were seen. The lumbosacral paraspinal muscles were tested at 3 levels, and revealed no abnormalities of insertional activity at all 3 levels tested. There was good relaxation.   IMPRESSION:  Nerve conduction studies done on both upper extremities and on the right lower extremity were within normal limits. No evidence of a peripheral neuropathy is seen. EMG evaluation of the right upper extremity and the right lower extremity were unremarkable, without evidence of a cervical or a lumbosacral radiculopathy on the right side.  Marlan Palau. Keith Willis MD 06/22/2013 10:57 AM  [facility] [address] [place], [place] [address]  Phone [phone] Fax [phone]

## 2013-07-09 ENCOUNTER — Ambulatory Visit (INDEPENDENT_AMBULATORY_CARE_PROVIDER_SITE_OTHER): Payer: Medicare Other | Admitting: Obstetrics & Gynecology

## 2013-07-09 ENCOUNTER — Encounter: Payer: Self-pay | Admitting: Obstetrics & Gynecology

## 2013-07-09 VITALS — BP 130/60 | Ht 64.0 in | Wt 206.0 lb

## 2013-07-09 DIAGNOSIS — Z3049 Encounter for surveillance of other contraceptives: Secondary | ICD-10-CM

## 2013-07-09 DIAGNOSIS — Z3202 Encounter for pregnancy test, result negative: Secondary | ICD-10-CM

## 2013-07-09 DIAGNOSIS — Z309 Encounter for contraceptive management, unspecified: Secondary | ICD-10-CM

## 2013-07-09 LAB — POCT URINE PREGNANCY: PREG TEST UR: NEGATIVE

## 2013-07-09 MED ORDER — MEDROXYPROGESTERONE ACETATE 150 MG/ML IM SUSP
150.0000 mg | Freq: Once | INTRAMUSCULAR | Status: AC
  Administered 2013-07-09: 150 mg via INTRAMUSCULAR

## 2013-07-09 NOTE — Progress Notes (Signed)
Pt here for Depo shot. Interested in tubal. Advised to schedule an appt with Dr. Despina HiddenEure to discuss. No other concerns at this time. Pt wants tubal before next Depo is due in 12 weeks. JSY

## 2013-07-13 ENCOUNTER — Encounter: Payer: Self-pay | Admitting: Obstetrics & Gynecology

## 2013-07-13 ENCOUNTER — Ambulatory Visit (INDEPENDENT_AMBULATORY_CARE_PROVIDER_SITE_OTHER): Payer: Medicare Other | Admitting: Obstetrics & Gynecology

## 2013-07-13 VITALS — BP 128/70 | Ht 64.0 in | Wt 205.0 lb

## 2013-07-13 DIAGNOSIS — Z309 Encounter for contraceptive management, unspecified: Secondary | ICD-10-CM

## 2013-07-13 DIAGNOSIS — Z302 Encounter for sterilization: Secondary | ICD-10-CM

## 2013-07-13 NOTE — Progress Notes (Signed)
Patient ID: Dominique Ferguson, female   DOB: 08-22-1970, 43 y.o.   MRN: [id number] Preoperative History and Physical  Dominique HonourShannon P Pieczynski is a 43 y.o. [id number] with No LMP recorded. Patient has had an injection. admitted for a laparoscopic bilateral salpingectomy for sterilization.  Discussed options and she chooses salpingectomy  PMH:    Past Medical History  Diagnosis Date  . Bipolar 1 disorder   . Obsessive compulsive disorder   . Bipolar 1 disorder   . OCD (obsessive compulsive disorder)   . Acne     PSH:     Past Surgical History  Procedure Laterality Date  . No past surgeries      POb/GynH:      OB History   Grav Para Term Preterm Abortions TAB SAB Ect Mult Living   5 2 2  2 2    2       SH:   History  Substance Use Topics  . Smoking status: Current Some Day Smoker    Types: Cigarettes  . Smokeless tobacco: Never Used  . Alcohol Use: No    FH:    Family History  Problem Relation Age of Onset  . Heart disease Other   . Cancer Other   . Seizures Other   . Mental illness Other   . Hypertension Father   . Diabetes Other     grandfather  . Congestive Heart Failure      grandmother     Allergies: No Known Allergies  Medications:      Current outpatient prescriptions:ALBUTEROL IN, Inhale into the lungs as needed., Disp: , Rfl: ;  clonazePAM (KLONOPIN) 1 MG tablet, Take 1 mg by mouth 4 (four) times daily., Disp: , Rfl: ;  divalproex (DEPAKOTE) 500 MG DR tablet, Take 500 mg by mouth at bedtime. 2 pills at bedtime, Disp: , Rfl:  gabapentin (NEURONTIN) 300 MG capsule, Week 1: 300mg  am 300mg  afternoon 600mg  night; wk 2: 300mg  am 600mg  afternoon 600mg  night; wk 3: 600mg  three times daily, Disp: 180 capsule, Rfl: 6;  Ipratropium-Albuterol (COMBIVENT IN), Inhale into the lungs daily., Disp: , Rfl: ;  medroxyPROGESTERone (DEPO-PROVERA) 150 MG/ML injection, FOR INJECT AT MD OFFICE EVERY 3 MONTHS, Disp: 1 mL, Rfl: 4;  oxybutynin (DITROPAN) 5 MG tablet, , Disp: , Rfl:  PROAIR  HFA 108 (90 BASE) MCG/ACT inhaler, , Disp: , Rfl: ;  QUEtiapine (SEROQUEL) 300 MG tablet, Take 300 mg by mouth at bedtime. 2 at bedtime., Disp: , Rfl: ;  SUMAtriptan Succinate (IMITREX PO), Take by mouth daily as needed., Disp: , Rfl: ;  SYMBICORT 80-4.5 MCG/ACT inhaler, , Disp: , Rfl: ;  tolterodine (DETROL LA) 2 MG 24 hr capsule, Take 2 mg by mouth daily., Disp: , Rfl:  traZODone (DESYREL) 100 MG tablet, Take 100 mg by mouth at bedtime. Taking 3 pills at bedtime, Disp: , Rfl: ;  mupirocin ointment (BACTROBAN) 2 %, , Disp: , Rfl: ;  QUEtiapine (SEROQUEL) 400 MG tablet, Take 400 mg by mouth 2 (two) times daily., Disp: , Rfl:   Review of Systems:   Review of Systems  Constitutional: Negative for fever, chills, weight loss, malaise/fatigue and diaphoresis.  HENT: Negative for hearing loss, ear pain, nosebleeds, congestion, sore throat, neck pain, tinnitus and ear discharge.   Eyes: Negative for blurred vision, double vision, photophobia, pain, discharge and redness.  Respiratory: Negative for cough, hemoptysis, sputum production, shortness of breath, wheezing and stridor.   Cardiovascular: Negative for chest pain, palpitations, orthopnea, claudication, leg swelling  and PND.  Gastrointestinal: negative for abdominal pain. Negative for heartburn, nausea, vomiting, diarrhea, constipation, blood in stool and melena.  Genitourinary: Negative for dysuria, urgency, frequency, hematuria and flank pain.  Musculoskeletal: Negative for myalgias, back pain, joint pain and falls.  Skin: Negative for itching and rash.  Neurological: Negative for dizziness, tingling, tremors, sensory change, speech change, focal weakness, seizures, loss of consciousness, weakness and headaches.  Endo/Heme/Allergies: Negative for environmental allergies and polydipsia. Does not bruise/bleed easily.  Psychiatric/Behavioral: Negative for depression, suicidal ideas, hallucinations, memory loss and substance abuse. The patient is not  nervous/anxious and does not have insomnia.      PHYSICAL EXAM:  Blood pressure 128/70, height 5\' 4"  (1.626 m), weight 205 lb (92.987 kg).    Vitals reviewed. Constitutional: She is oriented to person, place, and time. She appears well-developed and well-nourished.  HENT:  Head: Normocephalic and atraumatic.  Right Ear: External ear normal.  Left Ear: External ear normal.  Nose: Nose normal.  Mouth/Throat: Oropharynx is clear and moist.  Eyes: Conjunctivae and EOM are normal. Pupils are equal, round, and reactive to light. Right eye exhibits no discharge. Left eye exhibits no discharge. No scleral icterus.  Neck: Normal range of motion. Neck supple. No tracheal deviation present. No thyromegaly present.  Cardiovascular: Normal rate, regular rhythm, normal heart sounds and intact distal pulses.  Exam reveals no gallop and no friction rub.   No murmur heard. Respiratory: Effort normal and breath sounds normal. No respiratory distress. She has no wheezes. She has no rales. She exhibits no tenderness.  GI: Soft. Bowel sounds are normal. She exhibits no distension and no mass. There is tenderness. There is no rebound and no guarding.  Genitourinary:       Vulva is normal without lesions Vagina is pink moist without discharge Cervix normal in appearance and pap is normal Uterus is normal Adnexa is negative  Musculoskeletal: Normal range of motion. She exhibits no edema and no tenderness.  Neurological: She is alert and oriented to person, place, and time. She has normal reflexes. She displays normal reflexes. No cranial nerve deficit. She exhibits normal muscle tone. Coordination normal.  Skin: Skin is warm and dry. No rash noted. No erythema. No pallor.  Psychiatric: She has a normal mood and affect. Her behavior is normal. Judgment and thought content normal.    Labs: Results for orders placed in visit on 07/09/13 (from the past 336 hour(s))  POCT URINE PREGNANCY   Collection Time     07/09/13  9:06 AM      Result Value Ref Range   Preg Test, Ur Negative      EKG: No orders found for this or any previous visit.  Imaging Studies: No results found.    Assessment: Patient Active Problem List   Diagnosis Date Noted  . Contraception management 07/13/2013  . Headache(784.0) 06/08/2013  desires sterilization, specifically desires salpingectomy  Plan: Laparoscopic bilateral salpingectomy for sterilization  Lazaro ArmsLuther H Layn Kye 07/13/2013 9:42 AM

## 2013-08-05 ENCOUNTER — Encounter (HOSPITAL_COMMUNITY): Payer: Self-pay | Admitting: Pharmacy Technician

## 2013-08-10 NOTE — Patient Instructions (Signed)
Dominique HonourShannon P Ferguson  08/10/2013   Your procedure is scheduled on:   08/18/2013  [facility] at  [facility]  AM.  Call this number if you have problems the morning of surgery: [phone]   Remember:   Do not eat food or drink liquids after midnight.   Take these medicines the morning of surgery with A SIP OF WATER:  Seroquel, imitrex, clonazepam, depakote, neurontin, oxybutynin. Take your combivent, symbicort, albuterol before you come.   Do not wear jewelry, make-up or nail polish.  Do not wear lotions, powders, or perfumes.   Do not shave 48 hours prior to surgery. Men may shave face and neck.  Do not bring valuables to the hospital.  [facility]  [facility] any belongings or valuables.               Contacts, dentures or bridgework may not be worn into surgery.  Leave suitcase in the car. After surgery it may be brought to your room.  For patients admitted to the hospital, discharge time is determined by your treatment team.               Patients discharged the day of surgery will not be allowed to drive home.  Name and phone number of your driver: family  Special Instructions: Shower using CHG 2 nights before surgery and the night before surgery.  If you shower the day of surgery use CHG.  Use special wash - you have one bottle of CHG for all showers.  You should use approximately 1/3 of the bottle for each shower.   Please read over the following fact sheets that you were given: Pain Booklet, Coughing and Deep Breathing, Surgical Site Infection Prevention, Anesthesia Post-op Instructions and Care and Recovery After Surgery Sterilization Information, Female Female sterilization is a procedure to permanently prevent pregnancy. There are different ways to perform sterilization, but all either block or close the fallopian tubes so that your eggs cannot reach your uterus. If your egg cannot reach your uterus, sperm cannot fertilize the egg, and you cannot get pregnant.    Sterilization is performed by a surgical procedure. Sometimes these procedures are performed in a hospital while a patient is asleep. Sometimes they can be done in a clinic setting with the patient awake. The fallopian tubes can be surgically cut, tied, or sealed through a procedure called tubal ligation. The fallopian tubes can also be closed with clips or rings. Sterilization can also be done by placing a tiny coil into each fallopian tube, which causes scar tissue to grow inside the tube. The scar tissue then blocks the tubes.  Discuss sterilization with your caregiver to answer any concerns you or your partner may have. You may want to ask what type of sterilization your caregiver performs. Some caregivers may not perform all the various options. Sterilization is permanent and should only be done if you are sure you do not want children or do not want any more children. Having a sterilization reversed may not be successful.  STERILIZATION PROCEDURES  Laparoscopic sterilization. This is a surgical method performed at a time other than right after childbirth. Two incisions are made in the lower abdomen. A thin, lighted tube (laparoscope) is inserted into one of the incisions and is used to perform the procedure. The fallopian tubes are closed with a ring or a clip. An instrument that uses heat could be used to seal the tubes closed (electrocautery).  Mini-laparotomy. This is a surgical method done 1 or 2 days after giving birth. Typically, a small incision is made just below the belly button (umbilicus) and the fallopian tubes are exposed. The tubes can then be sealed, tied, or cut.   Hysteroscopic sterilization. This is performed at a time other than right after childbirth. A tiny, spring-like coil is inserted through the cervix and uterus and placed into the fallopian tubes. The coil causes scaring and blocks the tubes. Other forms of contraception should be used for 3 months after the procedure to  allow the scar tissue to form completely. Additionally, it is required hysterosalpingography be done 3 months later to ensure that the procedure was successful. Hysterosalpingography is a procedure that uses X-rays to look at your uterus and fallopian tubes after a material to make them show up better has been inserted. IS STERILIZATION SAFE? Sterilization is considered safe with very rare complications. Risks depend on the type of procedure you have. As with any surgical procedure, there are risks. Some risks of sterilization by any means include:   Bleeding.  Infection.  Reaction to anesthesia medicine.  Injury to surrounding organs. Risks specific to having hysteroscopic coils placed include:  The coils may not be placed correctly the first time.   The coils may move out of place.   The tubes may not get completely blocked after 3 months.   Injury to surrounding organs when placing the coil.  HOW EFFECTIVE IS FEMALE STERILIZATION? Sterilization is nearly 100% effective, but it can fail. Depending on the type of sterilization, the rate of failure can be as high as 3%. After hysteroscopic sterilization with placement of fallopian tube coils, you will need back-up birth control for 3 months after the procedure. Sterilization is effective for a lifetime.  BENEFITS OF STERILIZATION  It does not affect your hormones, and therefore will not affect your menstrual periods, sexual desire, or performance.   It is effective for a lifetime.   It is safe.   You do not need to worry about getting pregnant. Keep in mind that if you had the hysteroscopic placement procedure, you must wait 3 months after the procedure (or until your caregiver confirms) before pregnancy is not considered possible.   There are no side effects unlike other types of birth control (contraception).  DRAWBACKS OF STERILIZATION  You must be sure you do not want children or any more children. The procedure is  permanent.   It does not provide protection against sexually transmitted infections (STIs).   The tubes can grow back together. If this happens, there is a risk of pregnancy. There is also an increased risk (50%) of pregnancy being an ectopic pregnancy. This is a pregnancy that happens outside of the uterus. Document Released: 07/31/2007 Document Revised: 08/13/2011 Document Reviewed: 05/30/2011 [facility] Patient Information 2014 [employer] [place]. PATIENT INSTRUCTIONS POST-ANESTHESIA  IMMEDIATELY FOLLOWING SURGERY:  Do not drive or operate machinery for the first twenty four hours after surgery.  Do not make any important decisions for twenty four hours after surgery or while taking narcotic pain medications or sedatives.  If you develop intractable nausea and vomiting or a severe headache please notify your doctor immediately.  FOLLOW-UP:  Please make an appointment with your surgeon as instructed. You do not need to follow up with anesthesia unless specifically instructed to do so.  WOUND CARE INSTRUCTIONS (if applicable):  Keep a dry clean dressing on the anesthesia/puncture wound site if there is drainage.  Once the wound has  quit draining you may leave it open to air.  Generally you should leave the bandage intact for twenty four hours unless there is drainage.  If the epidural site drains for more than 36-48 hours please call the anesthesia department.  QUESTIONS?:  Please feel free to call your physician or the hospital operator if you have any questions, and they will be happy to assist you.

## 2013-08-11 ENCOUNTER — Encounter (HOSPITAL_COMMUNITY)
Admission: RE | Admit: 2013-08-11 | Discharge: 2013-08-11 | Disposition: A | Discharge status: Home / Self Care | Payer: Medicare Other | Source: Ambulatory Visit | Attending: Obstetrics & Gynecology | Admitting: Obstetrics & Gynecology

## 2013-08-11 ENCOUNTER — Other Ambulatory Visit: Payer: Self-pay

## 2013-08-11 ENCOUNTER — Encounter (HOSPITAL_COMMUNITY): Payer: Self-pay

## 2013-08-11 DIAGNOSIS — Z01810 Encounter for preprocedural cardiovascular examination: Secondary | ICD-10-CM | POA: Diagnosis present

## 2013-08-11 DIAGNOSIS — Z01812 Encounter for preprocedural laboratory examination: Secondary | ICD-10-CM | POA: Insufficient documentation

## 2013-08-11 HISTORY — DX: Unspecified asthma, uncomplicated: J45.909

## 2013-08-11 LAB — URINALYSIS, ROUTINE W REFLEX MICROSCOPIC
Bilirubin Urine: NEGATIVE
Glucose, UA: NEGATIVE mg/dL
Hgb urine dipstick: NEGATIVE
KETONES UR: NEGATIVE mg/dL
LEUKOCYTES UA: NEGATIVE
Nitrite: NEGATIVE
Protein, ur: NEGATIVE mg/dL
Specific Gravity, Urine: <1.005 — ABNORMAL LOW (ref 1.005–1.030)
UROBILINOGEN UA: 0.2 mg/dL (ref 0.0–1.0)
pH: 6 (ref 5.0–8.0)

## 2013-08-11 LAB — COMPREHENSIVE METABOLIC PANEL
ALT: 18 U/L (ref 0–35)
AST: 16 U/L (ref 0–37)
Albumin: 3.8 g/dL (ref 3.5–5.2)
Alkaline Phosphatase: 63 U/L (ref 39–117)
BILIRUBIN TOTAL: 0.3 mg/dL (ref 0.3–1.2)
BUN: 9 mg/dL (ref 6–23)
CALCIUM: 8.9 mg/dL (ref 8.4–10.5)
CO2: 22 mEq/L (ref 19–32)
Chloride: 105 mEq/L (ref 96–112)
Creatinine, Ser: 0.75 mg/dL (ref 0.50–1.10)
Glucose, Bld: 83 mg/dL (ref 70–99)
Potassium: 4 mEq/L (ref 3.7–5.3)
Sodium: 141 mEq/L (ref 137–147)
Total Protein: 7.1 g/dL (ref 6.0–8.3)

## 2013-08-11 LAB — CBC
HEMATOCRIT: 39.3 % (ref 36.0–46.0)
Hemoglobin: 13.7 g/dL (ref 12.0–15.0)
MCH: 28.6 pg (ref 26.0–34.0)
MCHC: 34.9 g/dL (ref 30.0–36.0)
MCV: 82 fL (ref 78.0–100.0)
Platelets: 196 10*3/uL (ref 150–400)
RBC: 4.79 MIL/uL (ref 3.87–5.11)
RDW: 14 % (ref 11.5–15.5)
WBC: 8.8 10*3/uL (ref 4.0–10.5)

## 2013-08-11 LAB — HCG, QUANTITATIVE, PREGNANCY: hCG, Beta Chain, Quant, S: <1 m[IU]/mL (ref <5)

## 2013-08-18 ENCOUNTER — Encounter (HOSPITAL_COMMUNITY): Payer: Medicare Other | Admitting: Anesthesiology

## 2013-08-18 ENCOUNTER — Ambulatory Visit (HOSPITAL_COMMUNITY)
Admission: RE | Admit: 2013-08-18 | Discharge: 2013-08-18 | Disposition: A | Discharge status: Home / Self Care | Payer: Medicare Other | Source: Ambulatory Visit | Attending: Obstetrics & Gynecology | Admitting: Obstetrics & Gynecology

## 2013-08-18 ENCOUNTER — Ambulatory Visit (HOSPITAL_COMMUNITY): Payer: Medicare Other | Admitting: Anesthesiology

## 2013-08-18 ENCOUNTER — Encounter (HOSPITAL_COMMUNITY): Payer: Self-pay | Admitting: Anesthesiology

## 2013-08-18 ENCOUNTER — Encounter (HOSPITAL_COMMUNITY)
Admission: RE | Disposition: A | Discharge status: Home / Self Care | Payer: Self-pay | Source: Ambulatory Visit | Attending: Obstetrics & Gynecology

## 2013-08-18 ENCOUNTER — Other Ambulatory Visit: Payer: Self-pay | Admitting: *Deleted

## 2013-08-18 DIAGNOSIS — Z9079 Acquired absence of other genital organ(s): Secondary | ICD-10-CM

## 2013-08-18 DIAGNOSIS — Z302 Encounter for sterilization: Secondary | ICD-10-CM | POA: Insufficient documentation

## 2013-08-18 DIAGNOSIS — Z79899 Other long term (current) drug therapy: Secondary | ICD-10-CM | POA: Diagnosis not present

## 2013-08-18 DIAGNOSIS — F319 Bipolar disorder, unspecified: Secondary | ICD-10-CM | POA: Insufficient documentation

## 2013-08-18 HISTORY — PX: LAPAROSCOPIC BILATERAL SALPINGECTOMY: SHX5889

## 2013-08-18 SURGERY — SALPINGECTOMY, BILATERAL, LAPAROSCOPIC
Anesthesia: General | Site: Abdomen | Laterality: Bilateral

## 2013-08-18 MED ORDER — MIDAZOLAM HCL 2 MG/2ML IJ SOLN
INTRAMUSCULAR | Status: AC
  Filled 2013-08-18: qty 2

## 2013-08-18 MED ORDER — PROPOFOL 10 MG/ML IV BOLUS
INTRAVENOUS | Status: AC
  Filled 2013-08-18: qty 20

## 2013-08-18 MED ORDER — ONDANSETRON HCL 8 MG PO TABS: 8.0000 mg | ORAL_TABLET | Freq: Three times a day (TID) | ORAL | Status: AC | PRN

## 2013-08-18 MED ORDER — LACTATED RINGERS IV SOLN
INTRAVENOUS | Status: DC | PRN
  Administered 2013-08-18 (×2): via INTRAVENOUS

## 2013-08-18 MED ORDER — BUPIVACAINE LIPOSOME 1.3 % IJ SUSP
INTRAMUSCULAR | Status: DC | PRN
  Administered 2013-08-18: 20 mL

## 2013-08-18 MED ORDER — KETOROLAC TROMETHAMINE 30 MG/ML IJ SOLN
30.0000 mg | Freq: Once | INTRAMUSCULAR | Status: AC
  Administered 2013-08-18: 30 mg via INTRAVENOUS
  Filled 2013-08-18: qty 1

## 2013-08-18 MED ORDER — NEOSTIGMINE METHYLSULFATE 10 MG/10ML IV SOLN
INTRAVENOUS | Status: DC | PRN
  Administered 2013-08-18: 4 mg via INTRAVENOUS

## 2013-08-18 MED ORDER — OXYCODONE-ACETAMINOPHEN 7.5-325 MG PO TABS: 1.0000 | ORAL_TABLET | Freq: Four times a day (QID) | ORAL | Status: DC | PRN

## 2013-08-18 MED ORDER — FENTANYL CITRATE 0.05 MG/ML IJ SOLN
INTRAMUSCULAR | Status: AC
  Filled 2013-08-18: qty 5

## 2013-08-18 MED ORDER — ONDANSETRON HCL 4 MG/2ML IJ SOLN
4.0000 mg | Freq: Once | INTRAMUSCULAR | Status: AC
  Administered 2013-08-18: 4 mg via INTRAVENOUS

## 2013-08-18 MED ORDER — LACTATED RINGERS IV SOLN
INTRAVENOUS | Status: DC
  Administered 2013-08-18: 09:00:00 via INTRAVENOUS

## 2013-08-18 MED ORDER — GLYCOPYRROLATE 0.2 MG/ML IJ SOLN
INTRAMUSCULAR | Status: AC
  Filled 2013-08-18: qty 1

## 2013-08-18 MED ORDER — CEFAZOLIN SODIUM-DEXTROSE 2-3 GM-% IV SOLR
2.0000 g | INTRAVENOUS | Status: AC
  Administered 2013-08-18: 2 g via INTRAVENOUS
  Filled 2013-08-18: qty 50

## 2013-08-18 MED ORDER — ROCURONIUM BROMIDE 100 MG/10ML IV SOLN
INTRAVENOUS | Status: DC | PRN
  Administered 2013-08-18: 10 mg via INTRAVENOUS
  Administered 2013-08-18: 30 mg via INTRAVENOUS

## 2013-08-18 MED ORDER — ARTIFICIAL TEARS OP OINT
TOPICAL_OINTMENT | OPHTHALMIC | Status: AC
  Filled 2013-08-18: qty 3.5

## 2013-08-18 MED ORDER — FENTANYL CITRATE 0.05 MG/ML IJ SOLN
INTRAMUSCULAR | Status: DC | PRN
  Administered 2013-08-18 (×2): 50 ug via INTRAVENOUS
  Administered 2013-08-18: 100 ug via INTRAVENOUS
  Administered 2013-08-18 (×3): 50 ug via INTRAVENOUS

## 2013-08-18 MED ORDER — 0.9 % SODIUM CHLORIDE (POUR BTL) OPTIME
TOPICAL | Status: DC | PRN
  Administered 2013-08-18: 1000 mL

## 2013-08-18 MED ORDER — KETOROLAC TROMETHAMINE 10 MG PO TABS: 10.0000 mg | ORAL_TABLET | Freq: Three times a day (TID) | ORAL | Status: DC | PRN

## 2013-08-18 MED ORDER — LIDOCAINE HCL (CARDIAC) 20 MG/ML IV SOLN
INTRAVENOUS | Status: DC | PRN
  Administered 2013-08-18: 50 mg via INTRAVENOUS

## 2013-08-18 MED ORDER — ONDANSETRON HCL 4 MG/2ML IJ SOLN
INTRAMUSCULAR | Status: AC
  Filled 2013-08-18: qty 2

## 2013-08-18 MED ORDER — PROPOFOL 10 MG/ML IV BOLUS
INTRAVENOUS | Status: DC | PRN
  Administered 2013-08-18: 150 mg via INTRAVENOUS

## 2013-08-18 MED ORDER — LIDOCAINE HCL (PF) 1 % IJ SOLN
INTRAMUSCULAR | Status: AC
  Filled 2013-08-18: qty 5

## 2013-08-18 MED ORDER — GLYCOPYRROLATE 0.2 MG/ML IJ SOLN
0.2000 mg | Freq: Once | INTRAMUSCULAR | Status: AC
  Administered 2013-08-18: 0.2 mg via INTRAVENOUS

## 2013-08-18 MED ORDER — GLYCOPYRROLATE 0.2 MG/ML IJ SOLN
INTRAMUSCULAR | Status: AC
  Filled 2013-08-18: qty 3

## 2013-08-18 MED ORDER — EPHEDRINE SULFATE 50 MG/ML IJ SOLN
INTRAMUSCULAR | Status: AC
  Filled 2013-08-18: qty 1

## 2013-08-18 MED ORDER — ONDANSETRON HCL 4 MG/2ML IJ SOLN: 4.0000 mg | Freq: Once | INTRAMUSCULAR | Status: DC | PRN

## 2013-08-18 MED ORDER — ROCURONIUM BROMIDE 50 MG/5ML IV SOLN
INTRAVENOUS | Status: AC
  Filled 2013-08-18: qty 1

## 2013-08-18 MED ORDER — SODIUM CHLORIDE 0.9 % IJ SOLN
INTRAMUSCULAR | Status: AC
  Filled 2013-08-18: qty 10

## 2013-08-18 MED ORDER — DEXAMETHASONE SODIUM PHOSPHATE 10 MG/ML IJ SOLN
INTRAMUSCULAR | Status: DC | PRN
  Administered 2013-08-18: 4 mg via INTRAVENOUS

## 2013-08-18 MED ORDER — BUPIVACAINE LIPOSOME 1.3 % IJ SUSP
20.0000 mL | Freq: Once | INTRAMUSCULAR | Status: DC
  Filled 2013-08-18: qty 20

## 2013-08-18 MED ORDER — BUPIVACAINE LIPOSOME 1.3 % IJ SUSP
INTRAMUSCULAR | Status: AC
  Filled 2013-08-18: qty 20

## 2013-08-18 MED ORDER — SODIUM CHLORIDE 0.9 % IJ SOLN
INTRAMUSCULAR | Status: AC
  Filled 2013-08-18: qty 20

## 2013-08-18 MED ORDER — PROMETHAZINE HCL 25 MG PO TABS: 25.0000 mg | ORAL_TABLET | Freq: Four times a day (QID) | ORAL | Status: AC | PRN

## 2013-08-18 MED ORDER — SUCCINYLCHOLINE CHLORIDE 20 MG/ML IJ SOLN
INTRAMUSCULAR | Status: AC
  Filled 2013-08-18: qty 1

## 2013-08-18 MED ORDER — DEXAMETHASONE SODIUM PHOSPHATE 4 MG/ML IJ SOLN
INTRAMUSCULAR | Status: AC
  Filled 2013-08-18: qty 1

## 2013-08-18 MED ORDER — FENTANYL CITRATE 0.05 MG/ML IJ SOLN: 25.0000 ug | INTRAMUSCULAR | Status: DC | PRN

## 2013-08-18 MED ORDER — ARTIFICIAL TEARS OP OINT
TOPICAL_OINTMENT | OPHTHALMIC | Status: DC | PRN
  Administered 2013-08-18: 1 via OPHTHALMIC

## 2013-08-18 MED ORDER — MIDAZOLAM HCL 2 MG/2ML IJ SOLN
1.0000 mg | INTRAMUSCULAR | Status: DC | PRN
  Administered 2013-08-18 (×2): 2 mg via INTRAVENOUS

## 2013-08-18 MED ORDER — DEXAMETHASONE SODIUM PHOSPHATE 4 MG/ML IJ SOLN
4.0000 mg | Freq: Once | INTRAMUSCULAR | Status: AC
  Administered 2013-08-18: 4 mg via INTRAVENOUS

## 2013-08-18 MED ORDER — FENTANYL CITRATE 0.05 MG/ML IJ SOLN
INTRAMUSCULAR | Status: AC
  Filled 2013-08-18: qty 2

## 2013-08-18 MED ORDER — MIDAZOLAM HCL 5 MG/5ML IJ SOLN
INTRAMUSCULAR | Status: DC | PRN
  Administered 2013-08-18: 2 mg via INTRAVENOUS

## 2013-08-18 MED ORDER — GLYCOPYRROLATE 0.2 MG/ML IJ SOLN
INTRAMUSCULAR | Status: DC | PRN
  Administered 2013-08-18: 0.6 mg via INTRAVENOUS

## 2013-08-18 SURGICAL SUPPLY — 54 items
APPLIER CLIP 5 13 M/L LIGAMAX5 (MISCELLANEOUS)
APR CLP MED LRG 5 ANG JAW (MISCELLANEOUS)
BAG HAMPER (MISCELLANEOUS) ×3 IMPLANT
BLADE 11 SAFETY STRL DISP (BLADE) ×1 IMPLANT
CLIP APPLIE 5 13 M/L LIGAMAX5 (MISCELLANEOUS) ×1 IMPLANT
CLOTH BEACON ORANGE TIMEOUT ST (SAFETY) ×3 IMPLANT
COVER LIGHT HANDLE STERIS (MISCELLANEOUS) ×6 IMPLANT
DRAPE PROXIMA HALF (DRAPES) ×1 IMPLANT
ELECT REM PT RETURN 9FT ADLT (ELECTROSURGICAL) ×3
ELECTRODE REM PT RTRN 9FT ADLT (ELECTROSURGICAL) ×1 IMPLANT
FILTER SMOKE EVAC LAPAROSHD (FILTER) ×3 IMPLANT
FORMALIN 10 PREFIL 480ML (MISCELLANEOUS) ×3 IMPLANT
GAUZE SPONGE 4X4 16PLY XRAY LF (GAUZE/BANDAGES/DRESSINGS) IMPLANT
GLOVE BIO SURGEON STRL SZ7 (GLOVE) ×4 IMPLANT
GLOVE BIOGEL PI IND STRL 7.0 (GLOVE) IMPLANT
GLOVE BIOGEL PI IND STRL 8 (GLOVE) ×1 IMPLANT
GLOVE BIOGEL PI INDICATOR 7.0 (GLOVE) ×2
GLOVE BIOGEL PI INDICATOR 8 (GLOVE) ×4
GLOVE ECLIPSE 8.0 STRL XLNG CF (GLOVE) ×3 IMPLANT
GLOVE EXAM NITRILE PF MED BLUE (GLOVE) ×2 IMPLANT
GOWN STRL REUS W/TWL LRG LVL3 (GOWN DISPOSABLE) ×3 IMPLANT
GOWN STRL REUS W/TWL XL LVL3 (GOWN DISPOSABLE) ×3 IMPLANT
INST SET LAPROSCOPIC GYN AP (KITS) ×3 IMPLANT
IV NS IRRIG 3000ML ARTHROMATIC (IV SOLUTION) ×1 IMPLANT
KIT ROOM TURNOVER APOR (KITS) ×3 IMPLANT
MANIFOLD NEPTUNE II (INSTRUMENTS) ×1 IMPLANT
NDL HYPO 18GX1.5 BLUNT FILL (NEEDLE) ×1 IMPLANT
NDL HYPO 21X1.5 SAFETY (NEEDLE) IMPLANT
NDL HYPO 25X1 1.5 SAFETY (NEEDLE) ×1 IMPLANT
NDL INSUFFLATION 14GA 120MM (NEEDLE) ×1 IMPLANT
NEEDLE HYPO 18GX1.5 BLUNT FILL (NEEDLE) ×3 IMPLANT
NEEDLE HYPO 21X1.5 SAFETY (NEEDLE) ×3 IMPLANT
NEEDLE HYPO 25X1 1.5 SAFETY (NEEDLE) IMPLANT
NEEDLE INSUFFLATION 120MM (ENDOMECHANICALS) ×3 IMPLANT
NEEDLE INSUFFLATION 14GA 120MM (NEEDLE) ×3 IMPLANT
PACK PERI GYN (CUSTOM PROCEDURE TRAY) ×3 IMPLANT
PAD ARMBOARD 7.5X6 YLW CONV (MISCELLANEOUS) ×3 IMPLANT
SCALPEL HARMONIC ACE (MISCELLANEOUS) ×3 IMPLANT
SET BASIN LINEN APH (SET/KITS/TRAYS/PACK) ×3 IMPLANT
SET TUBE IRRIG SUCTION NO TIP (IRRIGATION / IRRIGATOR) ×1 IMPLANT
SLEEVE ENDOPATH XCEL 5M (ENDOMECHANICALS) ×3 IMPLANT
SOLUTION ANTI FOG 6CC (MISCELLANEOUS) ×3 IMPLANT
SPONGE GAUZE 2X2 8PLY STER LF (GAUZE/BANDAGES/DRESSINGS) ×3
SPONGE GAUZE 2X2 8PLY STRL LF (GAUZE/BANDAGES/DRESSINGS) ×3 IMPLANT
STAPLER VISISTAT 35W (STAPLE) ×3 IMPLANT
SUT VICRYL 0 UR6 27IN ABS (SUTURE) ×3 IMPLANT
SYR 20CC LL (SYRINGE) ×3 IMPLANT
SYRINGE 10CC LL (SYRINGE) ×3 IMPLANT
TAPE CLOTH SURG 4X10 WHT LF (GAUZE/BANDAGES/DRESSINGS) ×2 IMPLANT
TRAY FOLEY CATH 16FR SILVER (SET/KITS/TRAYS/PACK) ×1 IMPLANT
TROCAR ENDO BLADELESS 11MM (ENDOMECHANICALS) ×3 IMPLANT
TROCAR XCEL NON-BLD 5MMX100MML (ENDOMECHANICALS) ×3 IMPLANT
TUBING INSUF HEATED (TUBING) ×3 IMPLANT
WARMER LAPAROSCOPE (MISCELLANEOUS) ×3 IMPLANT

## 2013-08-18 NOTE — Discharge Instructions (Signed)
Laparoscopic Tubal Ligation °Care After °Refer to this sheet in the next few weeks. These instructions provide you with information on caring for yourself after your procedure. Your caregiver may also give you more specific instructions. Your treatment has been planned according to current medical practices, but problems sometimes occur. Call your caregiver if you have any problems or questions after your procedure. °HOME CARE INSTRUCTIONS  °· Rest the remainder of the day. °· Only take over-the-counter or prescription medicines for pain, discomfort, or fever as directed by your caregiver. Do not take aspirin. It can cause bleeding. °· Gradually resume daily activities, diet, rest, driving, and work. °· Avoid sexual intercourse for 2 weeks or as directed. °· Do not use tampons or douche. °· Do not drive while taking pain medicine. °· Do not lift anything over 5 pounds for 2 weeks or as directed. °· Only take showers, not baths, until you are seen by your caregiver. °· Change bandages (dressings) as directed. °· Take your temperature twice a day and record it. °· Try to have help for the first 7 to 10 days for your household needs. °· Return to your caregiver to get your stitches (sutures) removed and for follow-up visits as directed. °SEEK MEDICAL CARE IF:  °· You have redness, swelling, or increasing pain in a wound. °· You have drainage from a wound lasting longer than 1 day. °· Your pain is getting worse. °· You have a rash. °· You become dizzy or lightheaded. °· You have a reaction to your medicine. °· You need stronger medicine or a change in your pain medicine. °· You notice a bad smell coming from a wound or dressing. °· Your wound breaks open after the sutures have been removed. °· You are constipated. °SEEK IMMEDIATE MEDICAL CARE IF:  °· You faint. °· You have a fever. °· You have increasing abdominal pain. °· You have severe pain in your shoulders. °· You have bleeding or drainage from the suture sites or  vagina following surgery. °· You have shortness of breath or difficulty breathing. °· You have chest or leg pain. °· You have persistent nausea, vomiting, or diarrhea. °MAKE SURE YOU:  °· Understand these instructions. °· Watch your condition. °· Get help right away if you are not doing well or get worse. °Document Released: 08/31/2004 Document Revised: 08/13/2011 Document Reviewed: 05/25/2011 °ExitCare® Patient Information ©2015 [employer]. This information is not intended to replace advice given to you by your health care provider. Make sure you discuss any questions you have with your health care provider. ° °

## 2013-08-18 NOTE — Anesthesia Preprocedure Evaluation (Signed)
Anesthesia Evaluation  Patient identified by MRN, date of birth, ID band Patient awake    Reviewed: Allergy & Precautions, H&P , NPO status , Patient's Chart, lab work & pertinent test results  Airway Mallampati: I TM Distance: >3 FB Neck ROM: Full    Dental  (+) Teeth Intact, Missing, Poor Dentition, Dental Advisory Given   Pulmonary asthma (used all her inhalers this am) , Current Smoker,  breath sounds clear to auscultation        Cardiovascular negative cardio ROS  Rhythm:Regular Rate:Normal     Neuro/Psych  Headaches, PSYCHIATRIC DISORDERS Bipolar Disorder    GI/Hepatic negative GI ROS,   Endo/Other    Renal/GU      Musculoskeletal   Abdominal   Peds  Hematology   Anesthesia Other Findings   Reproductive/Obstetrics                           Anesthesia Physical Anesthesia Plan  ASA: II  Anesthesia Plan: General   Post-op Pain Management:    Induction: Intravenous  Airway Management Planned: Oral ETT  Additional Equipment:   Intra-op Plan:   Post-operative Plan: Extubation in OR  Informed Consent: I have reviewed the patients History and Physical, chart, labs and discussed the procedure including the risks, benefits and alternatives for the proposed anesthesia with the patient or authorized representative who has indicated his/her understanding and acceptance.     Plan Discussed with:   Anesthesia Plan Comments:         Anesthesia Quick Evaluation

## 2013-08-18 NOTE — Op Note (Signed)
Preoperative Diagnosis:  1.  Multiparous female desires permanent sterilization                                          2.  Elects to have bilateral salpingectomy for ovarian cancer  prophylaxis  Postoperative Diagnosis:  Same as above  Procedure:  Laparoscopic Bilateral Salpingectomy  Surgeon:  Rockne CoonsLuther H Eure  Jr MD  Anaesthesia: general  Findings:  Patient had normal pelvic anatomy and no intraperitoneal abnormalities.  Description of Operation:  Patient was taken to the OR and placed into supine position where she underwent general anaesthesia.  She was placed in the dorsal lithotomy position and prepped and draped in the usual sterile fashion.  An incision was made in the umbilicus and dissection taken down to the rectus fascia which was incised and opened.  The non bladed trocar was then placed and the peritoneal cavity was insufflated.  The above noted findings were observed.  Additional trocars were placed in the right and left lower quadrants under direct visualization without difficulty.  The Harmonic scalpel was employed and salpingectomy of both the right and left tubes was performed.   The tubes were removed from the peritoneal cavity and sent to pathology.  There was good hemostasis bilaterally.  The fascia, peritoneum and subcutaneous tissue were closed using 0 vicryl.  The skin was closed using staples.  Exparel 266 mg 20 cc was injected in the 3 incision trocar sites. The patient was awakened from anaesthesia and taken to the PACU with all counts being correct x 3.  The patient received  1 gram of ancef andToradol 30 mg IV preoperatively.  EURE,LUTHER H 08/18/2013 11:45 AM

## 2013-08-18 NOTE — H&P (Signed)
Preoperative History and Physical  Dominique Ferguson is a 43 y.o. [id number] with No LMP recorded. Patient has had an injection. admitted for a laparoscopic bilateral salpingectomy for sterilization.  Discussed options and she chooses salpingectomy  PMH:  Past Medical History   Diagnosis  Date   .  Bipolar 1 disorder    .  Obsessive compulsive disorder    .  Bipolar 1 disorder    .  OCD (obsessive compulsive disorder)    .  Acne    PSH:  Past Surgical History   Procedure  Laterality  Date   .  No past surgeries     POb/GynH:  OB History    Grav  Para  Term  Preterm  Abortions  TAB  SAB  Ect  Mult  Living    5  2  2   2  2     2      SH:  History   Substance Use Topics   .  Smoking status:  Current Some Day Smoker     Types:  Cigarettes   .  Smokeless tobacco:  Never Used   .  Alcohol Use:  No   FH:  Family History   Problem  Relation  Age of Onset   .  Heart disease  Other    .  Cancer  Other    .  Seizures  Other    .  Mental illness  Other    .  Hypertension  Father    .  Diabetes  Other      grandfather   .  Congestive Heart Failure       grandmother   Allergies: No Known Allergies  Medications: Current outpatient prescriptions:ALBUTEROL IN, Inhale into the lungs as needed., Disp: , Rfl: ; clonazePAM (KLONOPIN) 1 MG tablet, Take 1 mg by mouth 4 (four) times daily., Disp: , Rfl: ; divalproex (DEPAKOTE) 500 MG DR tablet, Take 500 mg by mouth at bedtime. 2 pills at bedtime, Disp: , Rfl:  gabapentin (NEURONTIN) 300 MG capsule, Week 1: 300mg  am 300mg  afternoon 600mg  night; wk 2: 300mg  am 600mg  afternoon 600mg  night; wk 3: 600mg  three times daily, Disp: 180 capsule, Rfl: 6; Ipratropium-Albuterol (COMBIVENT IN), Inhale into the lungs daily., Disp: , Rfl: ; medroxyPROGESTERone (DEPO-PROVERA) 150 MG/ML injection, FOR INJECT AT MD OFFICE EVERY 3 MONTHS, Disp: 1 mL, Rfl: 4; oxybutynin (DITROPAN) 5 MG tablet, , Disp: , Rfl:  PROAIR HFA 108 (90 BASE) MCG/ACT inhaler, , Disp: , Rfl: ;  QUEtiapine (SEROQUEL) 300 MG tablet, Take 300 mg by mouth at bedtime. 2 at bedtime., Disp: , Rfl: ; SUMAtriptan Succinate (IMITREX PO), Take by mouth daily as needed., Disp: , Rfl: ; SYMBICORT 80-4.5 MCG/ACT inhaler, , Disp: , Rfl: ; tolterodine (DETROL LA) 2 MG 24 hr capsule, Take 2 mg by mouth daily., Disp: , Rfl:  traZODone (DESYREL) 100 MG tablet, Take 100 mg by mouth at bedtime. Taking 3 pills at bedtime, Disp: , Rfl: ; mupirocin ointment (BACTROBAN) 2 %, , Disp: , Rfl: ; QUEtiapine (SEROQUEL) 400 MG tablet, Take 400 mg by mouth 2 (two) times daily., Disp: , Rfl:  Review of Systems:  Review of Systems  Constitutional: Negative for fever, chills, weight loss, malaise/fatigue and diaphoresis.  HENT: Negative for hearing loss, ear pain, nosebleeds, congestion, sore throat, neck pain, tinnitus and ear discharge.  Eyes: Negative for blurred vision, double vision, photophobia, pain, discharge and redness.  Respiratory: Negative for cough, hemoptysis, sputum production, shortness  of breath, wheezing and stridor.  Cardiovascular: Negative for chest pain, palpitations, orthopnea, claudication, leg swelling and PND.  Gastrointestinal: negative for abdominal pain. Negative for heartburn, nausea, vomiting, diarrhea, constipation, blood in stool and melena.  Genitourinary: Negative for dysuria, urgency, frequency, hematuria and flank pain.  Musculoskeletal: Negative for myalgias, back pain, joint pain and falls.  Skin: Negative for itching and rash.  Neurological: Negative for dizziness, tingling, tremors, sensory change, speech change, focal weakness, seizures, loss of consciousness, weakness and headaches.  Endo/Heme/Allergies: Negative for environmental allergies and polydipsia. Does not bruise/bleed easily.  Psychiatric/Behavioral: Negative for depression, suicidal ideas, hallucinations, memory loss and substance abuse. The patient is not nervous/anxious and does not have insomnia.  PHYSICAL EXAM:   Blood pressure 128/70, height 5\' 4"  (1.626 m), weight 205 lb (92.987 kg).  Vitals reviewed.  Constitutional: She is oriented to person, place, and time. She appears well-developed and well-nourished.  HENT:  Head: Normocephalic and atraumatic.  Right Ear: External ear normal.  Left Ear: External ear normal.  Nose: Nose normal.  Mouth/Throat: Oropharynx is clear and moist.  Eyes: Conjunctivae and EOM are normal. Pupils are equal, round, and reactive to light. Right eye exhibits no discharge. Left eye exhibits no discharge. No scleral icterus.  Neck: Normal range of motion. Neck supple. No tracheal deviation present. No thyromegaly present.  Cardiovascular: Normal rate, regular rhythm, normal heart sounds and intact distal pulses. Exam reveals no gallop and no friction rub.  No murmur heard.  Respiratory: Effort normal and breath sounds normal. No respiratory distress. She has no wheezes. She has no rales. She exhibits no tenderness.  GI: Soft. Bowel sounds are normal. She exhibits no distension and no mass. There is tenderness. There is no rebound and no guarding.  Genitourinary:  Vulva is normal without lesions Vagina is pink moist without discharge Cervix normal in appearance and pap is normal Uterus is normal Adnexa is negative  Musculoskeletal: Normal range of motion. She exhibits no edema and no tenderness.  Neurological: She is alert and oriented to person, place, and time. She has normal reflexes. She displays normal reflexes. No cranial nerve deficit. She exhibits normal muscle tone. Coordination normal.  Skin: Skin is warm and dry. No rash noted. No erythema. No pallor.  Psychiatric: She has a normal mood and affect. Her behavior is normal. Judgment and thought content normal.  Labs: Results for orders placed during the hospital encounter of 08/11/13 (from the past 336 hour(s))  CBC   Collection Time    08/11/13  8:10 AM      Result Value Ref Range   WBC 8.8  4.0 - 10.5 K/uL    RBC 4.79  3.87 - 5.11 MIL/uL   Hemoglobin 13.7  12.0 - 15.0 g/dL   HCT 16.139.3  09.636.0 - 04.546.0 %   MCV 82.0  78.0 - 100.0 fL   MCH 28.6  26.0 - 34.0 pg   MCHC 34.9  30.0 - 36.0 g/dL   RDW 40.914.0  81.111.5 - 91.415.5 %   Platelets 196  150 - 400 K/uL  COMPREHENSIVE METABOLIC PANEL   Collection Time    08/11/13  8:10 AM      Result Value Ref Range   Sodium 141  137 - 147 mEq/L   Potassium 4.0  3.7 - 5.3 mEq/L   Chloride 105  96 - 112 mEq/L   CO2 22  19 - 32 mEq/L   Glucose, Bld 83  70 - 99 mg/dL   BUN 9  6 - 23 mg/dL   Creatinine, Ser 4.78  0.50 - 1.10 mg/dL   Calcium 8.9  8.4 - 29.5 mg/dL   Total Protein 7.1  6.0 - 8.3 g/dL   Albumin 3.8  3.5 - 5.2 g/dL   AST 16  0 - 37 U/L   ALT 18  0 - 35 U/L   Alkaline Phosphatase 63  39 - 117 U/L   Total Bilirubin 0.3  0.3 - 1.2 mg/dL   GFR calc non Af Amer >90  >90 mL/min   GFR calc Af Amer >90  >90 mL/min  HCG, QUANTITATIVE, PREGNANCY   Collection Time    08/11/13  8:10 AM      Result Value Ref Range   hCG, Beta Chain, Quant, S <1  <5 mIU/mL  URINALYSIS, ROUTINE W REFLEX MICROSCOPIC   Collection Time    08/11/13  8:10 AM      Result Value Ref Range   Color, Urine YELLOW  YELLOW   APPearance CLEAR  CLEAR   Specific Gravity, Urine <1.005 (*) 1.005 - 1.030   pH 6.0  5.0 - 8.0   Glucose, UA NEGATIVE  NEGATIVE mg/dL   Hgb urine dipstick NEGATIVE  NEGATIVE   Bilirubin Urine NEGATIVE  NEGATIVE   Ketones, ur NEGATIVE  NEGATIVE mg/dL   Protein, ur NEGATIVE  NEGATIVE mg/dL   Urobilinogen, UA 0.2  0.0 - 1.0 mg/dL   Nitrite NEGATIVE  NEGATIVE   Leukocytes, UA NEGATIVE  NEGATIVE    Results for orders placed in visit on 07/09/13 (from the past 336 hour(s))   POCT URINE PREGNANCY    Collection Time    07/09/13 9:06 AM   Result  Value  Ref Range    Preg Test, Ur  Negative    EKG:  No orders found for this or any previous visit.  Imaging Studies:  No results found.  Assessment:  Patient Active Problem List    Diagnosis  Date Noted   .   Contraception management  07/13/2013   .  Headache(784.0)  06/08/2013   desires sterilization, specifically desires salpingectomy  Plan:  Laparoscopic bilateral salpingectomy for sterilization   EURE,LUTHER H 08/18/2013 10:26 AM

## 2013-08-18 NOTE — Transfer of Care (Signed)
Immediate Anesthesia Transfer of Care Note  Patient: Dominique Ferguson  Procedure(s) Performed: Procedure(s): LAPAROSCOPIC BILATERAL SALPINGECTOMY (Bilateral)  Patient Location: PACU  Anesthesia Type:General  Level of Consciousness: awake, alert , oriented and patient cooperative  Airway & Oxygen Therapy: Patient Spontanous Breathing and Patient connected to face mask oxygen  Post-op Assessment: Report given to PACU RN and Post -op Vital signs reviewed and stable  Post vital signs: Reviewed and stable  Complications: No apparent anesthesia complications

## 2013-08-18 NOTE — Anesthesia Postprocedure Evaluation (Signed)
  Anesthesia Post-op Note  Patient: Dominique Ferguson  Procedure(s) Performed: Procedure(s): LAPAROSCOPIC BILATERAL SALPINGECTOMY (Bilateral)  Patient Location: PACU  Anesthesia Type:General  Level of Consciousness: awake, alert , oriented and patient cooperative  Airway and Oxygen Therapy: Patient Spontanous Breathing and Patient connected to face mask oxygen  Post-op Pain: mild  Post-op Assessment: Post-op Vital signs reviewed, Patient's Cardiovascular Status Stable, Respiratory Function Stable, Patent Airway, No signs of Nausea or vomiting and Pain level controlled  Post-op Vital Signs: Reviewed and stable  Last Vitals:  Filed Vitals:   08/18/13 1030  BP: 111/60  Pulse:   Temp:   Resp: 33    Complications: No apparent anesthesia complications

## 2013-08-18 NOTE — Anesthesia Procedure Notes (Signed)
Procedure Name: Intubation Date/Time: 08/18/2013 10:57 AM Performed by: Pernell DupreADAMS, AMY A Pre-anesthesia Checklist: Patient identified, Patient being monitored, Timeout performed, Emergency Drugs available and Suction available Patient Re-evaluated:Patient Re-evaluated prior to inductionOxygen Delivery Method: Circle System Utilized Preoxygenation: Pre-oxygenation with 100% oxygen Intubation Type: IV induction Ventilation: Mask ventilation without difficulty Laryngoscope Size: 3 and Miller Grade View: Grade I Tube type: Oral Tube size: 7.0 mm Number of attempts: 1 Airway Equipment and Method: stylet Placement Confirmation: ETT inserted through vocal cords under direct vision,  positive ETCO2 and breath sounds checked- equal and bilateral Secured at: 21 cm Tube secured with: Tape Dental Injury: Teeth and Oropharynx as per pre-operative assessment

## 2013-08-19 ENCOUNTER — Encounter (HOSPITAL_COMMUNITY): Payer: Self-pay | Admitting: Obstetrics & Gynecology

## 2013-08-25 ENCOUNTER — Telehealth: Payer: Self-pay | Admitting: Obstetrics & Gynecology

## 2013-08-25 NOTE — Telephone Encounter (Signed)
Pt states had a tubal ligation 08/18/2013 swelling bilateral calf, feet, ankles and toes, headaches today. Pt encouraged to elevated extremities, decrease salt intake, push fluids. Pt to keep her appt with Dr. Despina HiddenEure tomorrow morning. Pt verbalized understanding.

## 2013-08-26 ENCOUNTER — Encounter: Payer: Self-pay | Admitting: Obstetrics & Gynecology

## 2013-08-26 ENCOUNTER — Ambulatory Visit (INDEPENDENT_AMBULATORY_CARE_PROVIDER_SITE_OTHER): Payer: Self-pay | Admitting: Obstetrics & Gynecology

## 2013-08-26 VITALS — BP 120/60 | Ht 64.0 in | Wt 209.0 lb

## 2013-08-26 DIAGNOSIS — Z9889 Other specified postprocedural states: Secondary | ICD-10-CM

## 2013-08-26 DIAGNOSIS — Z9851 Tubal ligation status: Secondary | ICD-10-CM

## 2013-08-26 MED ORDER — OXYCODONE-ACETAMINOPHEN 7.5-325 MG PO TABS: 1.0000 | ORAL_TABLET | Freq: Four times a day (QID) | ORAL | Status: DC | PRN

## 2013-08-26 NOTE — Progress Notes (Signed)
Patient ID: Dominique Ferguson, female   DOB: 1971/02/05, 43 y.o.   MRN: [id number] 1 week post op from a laparoscopic salpingectomy bilateral for permanent sterilization Complaining of pain but pt has a long history of pain issues and mood disturbance see meds  Blood pressure 120/60, height 5\' 4"  (1.626 m), weight 209 lb (94.802 kg).  Exam Abdomen soft normal post op All 3 incisions look good, staples removed  Follow up prn

## 2013-12-10 ENCOUNTER — Other Ambulatory Visit: Payer: Self-pay

## 2013-12-27 ENCOUNTER — Encounter: Payer: Self-pay | Admitting: Obstetrics & Gynecology

## 2014-02-24 ENCOUNTER — Emergency Department (HOSPITAL_COMMUNITY)
Admission: EM | Admit: 2014-02-24 | Discharge: 2014-02-24 | Disposition: A | Discharge status: Home / Self Care | Payer: Medicare Other | Attending: Emergency Medicine | Admitting: Emergency Medicine

## 2014-02-24 ENCOUNTER — Emergency Department (HOSPITAL_COMMUNITY): Payer: Medicare Other

## 2014-02-24 ENCOUNTER — Encounter (HOSPITAL_COMMUNITY): Payer: Self-pay | Admitting: Emergency Medicine

## 2014-02-24 DIAGNOSIS — T148 Other injury of unspecified body region: Secondary | ICD-10-CM | POA: Diagnosis not present

## 2014-02-24 DIAGNOSIS — Z79899 Other long term (current) drug therapy: Secondary | ICD-10-CM | POA: Insufficient documentation

## 2014-02-24 DIAGNOSIS — W19XXXA Unspecified fall, initial encounter: Secondary | ICD-10-CM

## 2014-02-24 DIAGNOSIS — F42 Obsessive-compulsive disorder: Secondary | ICD-10-CM | POA: Insufficient documentation

## 2014-02-24 DIAGNOSIS — S3992XA Unspecified injury of lower back, initial encounter: Secondary | ICD-10-CM | POA: Diagnosis not present

## 2014-02-24 DIAGNOSIS — Z872 Personal history of diseases of the skin and subcutaneous tissue: Secondary | ICD-10-CM | POA: Diagnosis not present

## 2014-02-24 DIAGNOSIS — Y9289 Other specified places as the place of occurrence of the external cause: Secondary | ICD-10-CM | POA: Insufficient documentation

## 2014-02-24 DIAGNOSIS — S0990XA Unspecified injury of head, initial encounter: Secondary | ICD-10-CM | POA: Insufficient documentation

## 2014-02-24 DIAGNOSIS — T07XXXA Unspecified multiple injuries, initial encounter: Secondary | ICD-10-CM

## 2014-02-24 DIAGNOSIS — J45909 Unspecified asthma, uncomplicated: Secondary | ICD-10-CM | POA: Insufficient documentation

## 2014-02-24 DIAGNOSIS — Z720 Tobacco use: Secondary | ICD-10-CM | POA: Insufficient documentation

## 2014-02-24 DIAGNOSIS — S40212A Abrasion of left shoulder, initial encounter: Secondary | ICD-10-CM | POA: Diagnosis not present

## 2014-02-24 DIAGNOSIS — S199XXA Unspecified injury of neck, initial encounter: Secondary | ICD-10-CM | POA: Insufficient documentation

## 2014-02-24 DIAGNOSIS — S6992XA Unspecified injury of left wrist, hand and finger(s), initial encounter: Secondary | ICD-10-CM | POA: Diagnosis present

## 2014-02-24 DIAGNOSIS — S80212A Abrasion, left knee, initial encounter: Secondary | ICD-10-CM | POA: Insufficient documentation

## 2014-02-24 DIAGNOSIS — Y9389 Activity, other specified: Secondary | ICD-10-CM | POA: Diagnosis not present

## 2014-02-24 DIAGNOSIS — S62609A Fracture of unspecified phalanx of unspecified finger, initial encounter for closed fracture: Secondary | ICD-10-CM

## 2014-02-24 DIAGNOSIS — Y998 Other external cause status: Secondary | ICD-10-CM | POA: Diagnosis not present

## 2014-02-24 DIAGNOSIS — F319 Bipolar disorder, unspecified: Secondary | ICD-10-CM | POA: Diagnosis not present

## 2014-02-24 DIAGNOSIS — W108XXA Fall (on) (from) other stairs and steps, initial encounter: Secondary | ICD-10-CM | POA: Diagnosis not present

## 2014-02-24 DIAGNOSIS — S62613A Displaced fracture of proximal phalanx of left middle finger, initial encounter for closed fracture: Secondary | ICD-10-CM | POA: Diagnosis not present

## 2014-02-24 LAB — URINALYSIS, ROUTINE W REFLEX MICROSCOPIC
Bilirubin Urine: NEGATIVE
GLUCOSE, UA: NEGATIVE mg/dL
KETONES UR: NEGATIVE mg/dL
Leukocytes, UA: NEGATIVE
Nitrite: NEGATIVE
PROTEIN: NEGATIVE mg/dL
Specific Gravity, Urine: <1.005 — ABNORMAL LOW (ref 1.005–1.030)
UROBILINOGEN UA: 0.2 mg/dL (ref 0.0–1.0)
pH: 6.5 (ref 5.0–8.0)

## 2014-02-24 LAB — I-STAT CHEM 8, ED
BUN: 11 mg/dL (ref 6–23)
BUN: 11 mg/dL (ref 6–23)
CALCIUM ION: 1.14 mmol/L (ref 1.12–1.23)
CALCIUM ION: 1.2 mmol/L (ref 1.12–1.23)
CHLORIDE: 103 meq/L (ref 96–112)
CHLORIDE: 104 meq/L (ref 96–112)
CREATININE: 0.8 mg/dL (ref 0.50–1.10)
CREATININE: 0.8 mg/dL (ref 0.50–1.10)
GLUCOSE: 90 mg/dL (ref 70–99)
GLUCOSE: 90 mg/dL (ref 70–99)
HCT: 40 % (ref 36.0–46.0)
HCT: 42 % (ref 36.0–46.0)
Hemoglobin: 13.6 g/dL (ref 12.0–15.0)
Hemoglobin: 14.3 g/dL (ref 12.0–15.0)
Potassium: 4.1 mmol/L (ref 3.5–5.1)
Potassium: 4.2 mmol/L (ref 3.5–5.1)
Sodium: 137 mmol/L (ref 135–145)
Sodium: 138 mmol/L (ref 135–145)
TCO2: 22 mmol/L (ref 0–100)
TCO2: 24 mmol/L (ref 0–100)

## 2014-02-24 LAB — URINE MICROSCOPIC-ADD ON

## 2014-02-24 MED ORDER — IBUPROFEN 800 MG PO TABS
800.0000 mg | ORAL_TABLET | Freq: Once | ORAL | Status: AC
  Administered 2014-02-24: 800 mg via ORAL
  Filled 2014-02-24: qty 1

## 2014-02-24 MED ORDER — SODIUM CHLORIDE 0.9 % IJ SOLN
INTRAMUSCULAR | Status: AC
  Filled 2014-02-24: qty 500

## 2014-02-24 MED ORDER — SODIUM CHLORIDE 0.9 % IJ SOLN
INTRAMUSCULAR | Status: AC
  Filled 2014-02-24: qty 30

## 2014-02-24 MED ORDER — IOHEXOL 300 MG/ML  SOLN
100.0000 mL | Freq: Once | INTRAMUSCULAR | Status: AC | PRN
  Administered 2014-02-24: 100 mL via INTRAVENOUS

## 2014-02-24 MED ORDER — HYDROCODONE-ACETAMINOPHEN 5-325 MG PO TABS: 2.0000 | ORAL_TABLET | ORAL | Status: AC | PRN

## 2014-02-24 MED ORDER — IBUPROFEN 800 MG PO TABS: 800.0000 mg | ORAL_TABLET | Freq: Three times a day (TID) | ORAL | Status: AC

## 2014-02-24 NOTE — ED Notes (Signed)
Ambulate pt around nurses station; pt states she feels sore and a little dizzy, otherwise walked without difficulties

## 2014-02-24 NOTE — ED Notes (Signed)
PT stated she fell down 12 steps this morning. PT c/o neck, back, left arm/hand pain and a headache. PT stated no LOC and pt stated she hit her head. PT family present at bedside.

## 2014-02-24 NOTE — ED Notes (Signed)
Fell down 12 steps this morning.  C/o pain lower back,  Neck,  Head and left arm, hand.

## 2014-02-24 NOTE — Discharge Instructions (Signed)
Finger Fracture Follow-up with Dr. Hilda LiasKeeling this week. Take the pain medication as prescribed. Return to the ED if you develop new or worsening symptoms. Fractures of fingers are breaks in the bones of the fingers. There are many types of fractures. There are different ways of treating these fractures. Your health care provider will discuss the best way to treat your fracture. CAUSES Traumatic injury is the main cause of broken fingers. These include:  Injuries while playing sports.  Workplace injuries.  Falls. RISK FACTORS Activities that can increase your risk of finger fractures include:  Sports.  Workplace activities that involve machinery.  A condition called osteoporosis, which can make your bones less dense and cause them to fracture more easily. SIGNS AND SYMPTOMS The main symptoms of a broken finger are pain and swelling within 15 minutes after the injury. Other symptoms include:  Bruising of your finger.  Stiffness of your finger.  Numbness of your finger.  Exposed bones (compound fracture) if the fracture is severe. DIAGNOSIS  The best way to diagnose a broken bone is with X-ray imaging. Additionally, your health care provider will use this X-ray image to evaluate the position of the broken finger bones.  TREATMENT  Finger fractures can be treated with:   Nonreduction--This means the bones are in place. The finger is splinted without changing the positions of the bone pieces. The splint is usually left on for about a week to 10 days. This will depend on your fracture and what your health care provider thinks.  Closed reduction--The bones are put back into position without using surgery. The finger is then splinted.  Open reduction and internal fixation--The fracture site is opened. Then the bone pieces are fixed into place with pins or some type of hardware. This is seldom required. It depends on the severity of the fracture. HOME CARE INSTRUCTIONS   Follow your  health care provider's instructions regarding activities, exercises, and physical therapy.  Only take over-the-counter or prescription medicines for pain, discomfort, or fever as directed by your health care provider. SEEK MEDICAL CARE IF: You have pain or swelling that limits the motion or use of your fingers. SEEK IMMEDIATE MEDICAL CARE IF:  Your finger becomes numb. MAKE SURE YOU:   Understand these instructions.  Will watch your condition.  Will get help right away if you are not doing well or get worse. Document Released: 05/26/2000 Document Revised: 12/02/2012 Document Reviewed: 09/23/2012 [facility] Patient Information 2015 [employer] [place]. This information is not intended to replace advice given to you by your health care provider. Make sure you discuss any questions you have with your health care provider.

## 2014-02-24 NOTE — ED Notes (Signed)
Patient with no complaints at this time. Respirations even and unlabored. Skin warm/dry. Discharge instructions reviewed with patient at this time. Patient given opportunity to voice concerns/ask questions. IV removed per policy and band-aid applied to site. Patient discharged at this time and left Emergency Department with steady gait.  

## 2014-02-24 NOTE — ED Provider Notes (Signed)
CSN: [id number]     Arrival date & time 02/24/14  [id number] History  This chart was scribed for Glynn OctaveStephen Mirra Basilio, MD by Abel PrestoKara Demonbreun, ED Scribe. This patient was seen in room [facility]12/[facility]12 and the patient's care was started at 7:35 AM.    Chief Complaint  Patient presents with  . Fall     Patient is a 43 y.o. female presenting with fall. The history is provided by the patient. No language interpreter was used.  Fall Pertinent negatives include no abdominal pain and no shortness of breath.    HPI Comments: Dominique Ferguson is a 43 y.o. female who presents to the Emergency Department complaining of a fall down 12 steps PTA. Pt states she slipped and fell but denies dizziness at the time.  Pt notes associated nausea, posterior head, neck, left shoulder, left wrist, left hand, and left knee pain with numbness in her legs.  Pt states she was unable to get up on her own.  Pt has PMHx of asthma but denies Hx of back problems.  Pt denies chance of pregnancy with PSH of laparoscopic bilateral salpingectomy. Pt is currently on her menstrual period. Pt denies use of ASA, blood thinners, or pain medication at home.  Pt has NKDA. She states last tetanus was 3-4 years ago. Pt denies dyspnea, abdominal pain, and bowel or urination incontinence following fall.    Past Medical History  Diagnosis Date  . Bipolar 1 disorder   . Obsessive compulsive disorder   . Bipolar 1 disorder   . OCD (obsessive compulsive disorder)   . Acne   . Asthma    Past Surgical History  Procedure Laterality Date  . Laparoscopic bilateral salpingectomy Bilateral 08/18/2013    Procedure: LAPAROSCOPIC BILATERAL SALPINGECTOMY;  Surgeon: Lazaro ArmsLuther H Eure, MD;  Location: [facility];  Service: Gynecology;  Laterality: Bilateral;   Family History  Problem Relation Age of Onset  . Heart disease Other   . Cancer Other   . Seizures Other   . Mental illness Other   . Hypertension Father   . Diabetes Other     grandfather  . Congestive Heart  Failure      grandmother   History  Substance Use Topics  . Smoking status: Current Some Day Smoker -- 1.00 packs/day    Types: Cigarettes  . Smokeless tobacco: Never Used  . Alcohol Use: No   OB History    Gravida Para Term Preterm AB TAB SAB Ectopic Multiple Living   5 2 2  2 2    2      Review of Systems  Respiratory: Negative for shortness of breath.   Gastrointestinal: Positive for nausea. Negative for abdominal pain.  Musculoskeletal: Positive for myalgias, back pain, arthralgias and neck pain.  Neurological: Negative for dizziness.   A complete 10 system review of systems was obtained and all systems are negative except as noted in the HPI and PMH.     Allergies  Review of patient's allergies indicates no known allergies.  Home Medications   Prior to Admission medications   Medication Sig Start Date End Date Taking? Authorizing Provider  albuterol (PROVENTIL HFA;VENTOLIN HFA) 108 (90 BASE) MCG/ACT inhaler Inhale 2 puffs into the lungs every 6 (six) hours as needed for wheezing or shortness of breath.   Yes Historical Provider, MD  clonazePAM (KLONOPIN) 1 MG tablet Take 1 mg by mouth 4 (four) times daily.   Yes Historical Provider, MD  COMBIVENT RESPIMAT 20-100 MCG/ACT AERS respimat Inhale 1 puff  into the lungs 4 (four) times daily. 02/05/14  Yes Historical Provider, MD  divalproex (DEPAKOTE ER) 500 MG 24 hr tablet Take 1,000 mg by mouth daily. 02/05/14  Yes Historical Provider, MD  escitalopram (LEXAPRO) 10 MG tablet Take 2 tablets by mouth daily. 02/05/14  Yes Historical Provider, MD  gabapentin (NEURONTIN) 300 MG capsule Take 300 mg by mouth 3 (three) times daily. 1 capsule in the morning, 1 capsule in the afternoon, and 2 capsules at bedtime.   Yes Historical Provider, MD  Ipratropium-Albuterol (COMBIVENT IN) Inhale 2 puffs into the lungs daily.    Yes Historical Provider, MD  oxybutynin (DITROPAN) 5 MG tablet Take 1 tablet by mouth 2 (two) times daily. 02/05/14  Yes  Historical Provider, MD  polyethylene glycol powder (GLYCOLAX/MIRALAX) powder Take 17 g by mouth daily as needed for mild constipation or moderate constipation.  02/05/14  Yes Historical Provider, MD  QUEtiapine (SEROQUEL) 300 MG tablet Take 600 mg by mouth at bedtime.    Yes Historical Provider, MD  QUEtiapine (SEROQUEL) 400 MG tablet Take 200 mg by mouth 2 (two) times daily.    Yes Historical Provider, MD  SUMAtriptan Succinate (IMITREX PO) Take 2 tablets by mouth daily as needed (migraine).    Yes Historical Provider, MD  SYMBICORT 80-4.5 MCG/ACT inhaler Inhale 2 puffs into the lungs 2 (two) times daily.  05/17/13  Yes Historical Provider, MD  traZODone (DESYREL) 100 MG tablet Take 300 mg by mouth at bedtime.    Yes Historical Provider, MD  HYDROcodone-acetaminophen (NORCO/VICODIN) 5-325 MG per tablet Take 2 tablets by mouth every 4 (four) hours as needed. 02/24/14   Glynn Octave, MD  ibuprofen (ADVIL,MOTRIN) 800 MG tablet Take 1 tablet (800 mg total) by mouth 3 (three) times daily. 02/24/14   Glynn Octave, MD  ketorolac (TORADOL) 10 MG tablet Take 1 tablet (10 mg total) by mouth every 8 (eight) hours as needed. Patient not taking: Reported on 02/24/2014 08/18/13   Lazaro Arms, MD  ondansetron (ZOFRAN) 8 MG tablet Take 1 tablet (8 mg total) by mouth every 8 (eight) hours as needed for nausea. Patient not taking: Reported on 02/24/2014 08/18/13   Lazaro Arms, MD  oxyCODONE-acetaminophen (PERCOCET) 7.5-325 MG per tablet Take 1-2 tablets by mouth every 6 (six) hours as needed for pain. Patient not taking: Reported on 02/24/2014 08/26/13   Lazaro Arms, MD  promethazine (PHENERGAN) 25 MG tablet Take 1 tablet (25 mg total) by mouth every 6 (six) hours as needed for nausea or vomiting. Patient not taking: Reported on 02/24/2014 08/18/13   Lazaro Arms, MD   BP 126/64 mmHg  Pulse 79  Temp(Src) 98.1 F (36.7 C) (Oral)  Resp 18  Ht 5\' 4"  (1.626 m)  Wt 207 lb (93.895 kg)  BMI 35.51 kg/m2   SpO2 96%  LMP 02/22/2014 Physical Exam  Constitutional: She is oriented to person, place, and time. She appears well-developed and well-nourished. No distress.  HENT:  Head: Normocephalic and atraumatic.  Mouth/Throat: Oropharynx is clear and moist. No oropharyngeal exudate.  Eyes: Conjunctivae and EOM are normal. Pupils are equal, round, and reactive to light.  Neck: Normal range of motion. Neck supple.  No meningismus.  Cardiovascular: Normal rate, regular rhythm, normal heart sounds and intact distal pulses.   No murmur heard. Distal pulses equal  Pulmonary/Chest: Effort normal and breath sounds normal. No respiratory distress. She exhibits no tenderness.  Abdominal: Soft. There is no tenderness. There is no rebound and no guarding.  Musculoskeletal: Normal range of motion. She exhibits no edema.       Left shoulder: She exhibits tenderness.       Left wrist: She exhibits tenderness. She exhibits no deformity.       Left knee: Tenderness found.       Cervical back: She exhibits tenderness (upper).       Lumbar back: She exhibits tenderness (lower).       Left hand: She exhibits tenderness (dorsal). She exhibits no deformity.  Left shoulder: abrasion noted Left knee: abrasion   Neurological: She is alert and oriented to person, place, and time. No cranial nerve deficit. She exhibits normal muscle tone. Coordination normal.  5/5 strength throughout. CN II-XII intact.   Skin: Skin is warm.  Psychiatric: She has a normal mood and affect. Her behavior is normal.  Nursing note and vitals reviewed.   ED Course  Procedures (including critical care time) DIAGNOSTIC STUDIES: Oxygen Saturation is 96% on room air, normal by my interpretation.    COORDINATION OF CARE: 7:43 AM Discussed treatment plan with patient at beside, the patient agrees with the plan and has no further questions at this time.   Labs Review Labs Reviewed  URINALYSIS, ROUTINE W REFLEX MICROSCOPIC - Abnormal;  Notable for the following:    Specific Gravity, Urine <1.005 (*)    Hgb urine dipstick LARGE (*)    All other components within normal limits  URINE MICROSCOPIC-ADD ON - Abnormal; Notable for the following:    Squamous Epithelial / LPF MANY (*)    All other components within normal limits  I-STAT CHEM 8, ED  I-STAT CHEM 8, ED    Imaging Review Dg Chest 2 View  02/24/2014   CLINICAL DATA:  Status post fall down 12 steps this morning with back and neck and left upper extremity discomfort  EXAM: CHEST  2 VIEW  COMPARISON:  Portable chest x-ray of January 01, 2004  FINDINGS: The lungs are adequately inflated and clear. There is no evidence of a pulmonary contusion, pneumothorax, or pneumomediastinum. There is no pleural effusion. The heart and mediastinal structures are normal. The observed portions of the bony thorax are unremarkable.  IMPRESSION: There is no active cardiopulmonary disease. No acute thoracic post traumatic injury is demonstrated.   Electronically Signed   By: David  [place]   On: 02/24/2014 09:07   Dg Lumbar Spine Complete  02/24/2014   CLINICAL DATA:  Fall down steps this a.m.  Initial evaluation.  EXAM: LUMBAR SPINE - COMPLETE 4+ VIEW  COMPARISON:  None.  FINDINGS: Diffuse degenerative changes lumbar spine. No acute bony abnormality identified. Pedicles are intact. Mineralization normal. Normal alignment.  IMPRESSION: Diffuse degenerative change.  No acute abnormality.   Electronically Signed   By: Maisie Fus  Register   On: 02/24/2014 08:56   Dg Wrist Complete Left  02/24/2014   CLINICAL DATA:  Status post fall down 12 steps this morning with hand and wrist pain  EXAM: LEFT WRIST - COMPLETE 3+ VIEW  COMPARISON:  Left hand series of today's date  FINDINGS: The bones of the wrist are adequately mineralized. The distal radius and ulna are intact. There is a smoothly rounded calcification just distal to the tip of the ulnar styloid which may reflect an old avulsion fracture or soft  tissue calcification. The carpal bones and intercarpal joints are intact. The carpometacarpal joints are unremarkable. The soft tissues of the wrist are normal.  IMPRESSION: There is no acute bony abnormality of the left wrist.  Electronically Signed   By: David  [place]   On: 02/24/2014 09:05   Ct Head Wo Contrast  02/24/2014   CLINICAL DATA:  Patient fell down steps hitting back of head. Head and neck pain  EXAM: CT HEAD WITHOUT CONTRAST  CT CERVICAL SPINE WITHOUT CONTRAST  TECHNIQUE: Multidetector CT imaging of the head and cervical spine was performed following the standard protocol without intravenous contrast. Multiplanar CT image reconstructions of the cervical spine were also generated.  COMPARISON:  None.  FINDINGS: CT HEAD FINDINGS  The ventricles are normal in size and configuration. There is no mass, hemorrhage, extra-axial fluid collection or midline shift. There is minimal small vessel disease in the centra semiovale bilaterally. Elsewhere gray-white compartments appear normal. No acute infarct apparent. Bony calvarium appears intact. The mastoid air cells are clear.  CT CERVICAL SPINE FINDINGS  There is no fracture or spondylolisthesis. Prevertebral soft tissues and predental space regions are normal. There is moderate disc space narrowing at C6-7. There are anterior and posterior osteophytes at the inferior aspect of C5. Disc spaces appear normal. There is bony hypertrophy at C5-6 on the right with localized exit foraminal narrowing on the right at C5-6. No frank disc extrusion or stenosis.  IMPRESSION: CT head: Minimal periventricular small vessel disease. No intracranial mass, hemorrhage, or extra-axial fluid.  CT cervical spine: No fracture or spondylolisthesis. Bony overgrowth the right at C5-6 causing exit foraminal narrowing at this site.   Electronically Signed   By: Bretta Bang M.D.   On: 02/24/2014 08:23   Ct Cervical Spine Wo Contrast  02/24/2014   CLINICAL DATA:  Patient fell  down steps hitting back of head. Head and neck pain  EXAM: CT HEAD WITHOUT CONTRAST  CT CERVICAL SPINE WITHOUT CONTRAST  TECHNIQUE: Multidetector CT imaging of the head and cervical spine was performed following the standard protocol without intravenous contrast. Multiplanar CT image reconstructions of the cervical spine were also generated.  COMPARISON:  None.  FINDINGS: CT HEAD FINDINGS  The ventricles are normal in size and configuration. There is no mass, hemorrhage, extra-axial fluid collection or midline shift. There is minimal small vessel disease in the centra semiovale bilaterally. Elsewhere gray-white compartments appear normal. No acute infarct apparent. Bony calvarium appears intact. The mastoid air cells are clear.  CT CERVICAL SPINE FINDINGS  There is no fracture or spondylolisthesis. Prevertebral soft tissues and predental space regions are normal. There is moderate disc space narrowing at C6-7. There are anterior and posterior osteophytes at the inferior aspect of C5. Disc spaces appear normal. There is bony hypertrophy at C5-6 on the right with localized exit foraminal narrowing on the right at C5-6. No frank disc extrusion or stenosis.  IMPRESSION: CT head: Minimal periventricular small vessel disease. No intracranial mass, hemorrhage, or extra-axial fluid.  CT cervical spine: No fracture or spondylolisthesis. Bony overgrowth the right at C5-6 causing exit foraminal narrowing at this site.   Electronically Signed   By: Bretta Bang M.D.   On: 02/24/2014 08:23   Ct Abdomen Pelvis W Contrast  02/24/2014   CLINICAL DATA:  Larey Seat down 12 steps this morning. Patient is having right flank pain and hematuria.  EXAM: CT ABDOMEN AND PELVIS WITH CONTRAST  TECHNIQUE: Multidetector CT imaging of the abdomen and pelvis was performed using the standard protocol following bolus administration of intravenous contrast.  CONTRAST:  OMNIPAQUE IOHEXOL 300 MG/ML  SOLN  COMPARISON:  None.  FINDINGS: Lower  chest: The lung bases demonstrate patchy dependent bibasilar atelectasis.  There are also small bilateral pleural effusions. The heart is normal in size. No pericardial effusion. The distal esophagus is grossly normal. The lower ribs are intact.  Hepatobiliary: No acute abnormality involving the liver. There is a small cyst near the falciform ligament. Numerous rim calcified gallstones are noted contracted gallbladder. No intra or extrahepatic biliary dilatation.  Pancreas: Normal  Spleen: Normal  Adrenals/Urinary Tract: The adrenal glands and kidneys are normal. No acute renal injury is identified. No perinephric hematoma. The delayed images do not demonstrate any collecting system abnormality. The ureters appear normal.  Stomach/Bowel: The stomach, duodenum, small bowel and colon grossly normal without oral contrast. The terminal ileum is normal. The appendix is normal.  Vascular/Lymphatic: No mesenteric or retroperitoneal, adenopathy or hematoma. Small scattered lymph nodes are noted. The aorta and branch vessels are normal. The major venous structures are patent.  Reproductive: The uterus and ovaries are normal. The bladder is normal. No pelvic mass, adenopathy or free pelvic fluid collections. No inguinal mass or lymphadenopathy.  Other: No abdominal wall hernia or subcutaneous lesions.  Musculoskeletal: No significant bony findings.  IMPRESSION: No acute abdominal/pelvic findings, mass lesions or adenopathy.  Cholelithiasis.  No acute bony findings.  Small bilateral pleural effusions and bibasilar atelectasis.   Electronically Signed   By: Loralie ChampagneMark  Gallerani M.D.   On: 02/24/2014 11:14   Dg Shoulder Left  02/24/2014   CLINICAL DATA:  Status post fall down 12 steps today now with left upper extremity pain  EXAM: LEFT SHOULDER - 2+ VIEW  COMPARISON:  None.  FINDINGS: [facility] and glenohumeral joint are intact. There is no acute fracture of the proximal humerus nor of the left clavicle nor visualized portions  of the scapula. The observed portions of the upper left ribs are normal. The soft tissues are unremarkable.  IMPRESSION: There is no acute bony abnormality of the left shoulder.   Electronically Signed   By: David  [place]   On: 02/24/2014 09:06   Dg Knee Complete 4 Views Left  02/24/2014   CLINICAL DATA:  Status post fall down 12 steps this morning with multiple sites of pain  EXAM: LEFT KNEE - COMPLETE 4+ VIEW  COMPARISON:  None.  FINDINGS: The bones of the left knee are adequately mineralized. There is no acute fracture nor dislocation. There is mild beaking of the tibial spines. The joint spaces are preserved. There is no joint effusion. The proximal fibula is intact.  IMPRESSION: There is no acute bony abnormality of the left knee.   Electronically Signed   By: David  [place]   On: 02/24/2014 08:55   Dg Hand Complete Left  02/24/2014   CLINICAL DATA:  Status post fall down 12 steps this morning. , left hand pain  EXAM: LEFT HAND - COMPLETE 3+ VIEW  COMPARISON:  None.  FINDINGS: The bones of the left hand are adequately mineralized. There are bony fragments noted on the lateral film involving the ventral aspects of the bases of the middle phalanges of the third, fourth, and fifth fingers compatible with avulsion fractures. The thumb and forefinger are intact. There is mild degenerative interphalangeal joint change. The metacarpals are intact. The soft tissues are swollen over the third through fifth digits.  IMPRESSION: The patient has sustained fractures of the bases of the middle phalanges of the third, fourth, and fifth digits in the region of the insertion of the flexor tendons. This may reflect a hyperextension injury mechanism.   Electronically Signed   By: Onalee Huaavid  [place]   On: 02/24/2014 08:59     EKG Interpretation None      MDM   Final diagnoses:  Fall  Multiple fractures of fingers, closed, initial encounter  Multiple contusions   mechanical fall down a flight of steps. He did not  lose consciousness. Complains of head, neck, low back, left hand and shoulder pain. No blood thinner use. No chest pain, shortness of breath, abdominal pain.  Neurological exam nonfocal. CT head obtained due to mechanism. CT head and C-spine negative.  X-rays remarkable for volar plate fractures of middle phalanges of third fourth and fifth digits. Patient is able to flex them. Discussed with Dr. Hilda Lias who recommends gutter splint immobilization in slight flexion. Hematuria noted on UA. Patient currently on menstrual cycle. CT negative for intraabdominal pathology.  Patient tolerating PO and ambulatory.  Treat symptomatically and follow up with Dr. Hilda Lias this week Return precautions discussed.  BP 126/64 mmHg  Pulse 79  Temp(Src) 98.1 F (36.7 C) (Oral)  Resp 18  Ht 5\' 4"  (1.626 m)  Wt 207 lb (93.895 kg)  BMI 35.51 kg/m2  SpO2 96%  LMP 02/22/2014   I personally performed the services described in this documentation, which was scribed in my presence. The recorded information has been reviewed and is accurate.     Glynn Octave, MD 02/24/14 [phone]

## 2014-04-15 ENCOUNTER — Other Ambulatory Visit: Payer: Self-pay | Admitting: Neurology

## 2014-04-15 DIAGNOSIS — R200 Anesthesia of skin: Secondary | ICD-10-CM

## 2014-04-15 DIAGNOSIS — R51 Headache: Secondary | ICD-10-CM

## 2014-04-15 DIAGNOSIS — R519 Headache, unspecified: Secondary | ICD-10-CM

## 2014-04-27 ENCOUNTER — Other Ambulatory Visit (HOSPITAL_COMMUNITY): Payer: Self-pay | Admitting: Radiology

## 2014-04-27 DIAGNOSIS — G4730 Sleep apnea, unspecified: Secondary | ICD-10-CM

## 2014-06-03 ENCOUNTER — Ambulatory Visit: Payer: Medicare Other | Attending: Neurology | Admitting: Sleep Medicine

## 2014-06-03 DIAGNOSIS — R200 Anesthesia of skin: Secondary | ICD-10-CM | POA: Insufficient documentation

## 2014-06-03 DIAGNOSIS — G4730 Sleep apnea, unspecified: Secondary | ICD-10-CM

## 2014-06-03 DIAGNOSIS — R51 Headache: Secondary | ICD-10-CM | POA: Diagnosis not present

## 2014-06-06 ENCOUNTER — Ambulatory Visit
Admission: RE | Admit: 2014-06-06 | Discharge: 2014-06-06 | Disposition: A | Discharge status: Home / Self Care | Payer: Medicare Other | Source: Ambulatory Visit | Attending: Neurology | Admitting: Neurology

## 2014-06-06 DIAGNOSIS — R200 Anesthesia of skin: Secondary | ICD-10-CM

## 2014-06-06 DIAGNOSIS — R51 Headache: Secondary | ICD-10-CM

## 2014-06-06 DIAGNOSIS — R519 Headache, unspecified: Secondary | ICD-10-CM

## 2014-06-06 NOTE — Sleep Study (Signed)
[facility] Amyrah Pinkhasov A. Gerilyn Pilgrimoonquah, MD     [email or web address]        NOCTURNAL POLYSOMNOGRAM    LOCATION: SLEEP LAB FACILITY: [facility]   PHYSICIAN: Carlyle Achenbach A. Gerilyn Pilgrimoonquah, M.D.   DATE OF STUDY: 06/03/2014.   REFERRING PHYSICIAN: Abisola Carrero.   INDICATIONS: The patient is a 44 year old female who presents with loud snoring, awakening with difficulty breathing and daytime fatigue. There is also hypersomnia.  MEDICATIONS:  Prior to Admission medications   Medication Sig Start Date End Date Taking? Authorizing Provider  albuterol (PROVENTIL HFA;VENTOLIN HFA) 108 (90 BASE) MCG/ACT inhaler Inhale 2 puffs into the lungs every 6 (six) hours as needed for wheezing or shortness of breath.    Historical Provider, MD  clonazePAM (KLONOPIN) 1 MG tablet Take 1 mg by mouth 4 (four) times daily.    Historical Provider, MD  COMBIVENT RESPIMAT 20-100 MCG/ACT AERS respimat Inhale 1 puff into the lungs 4 (four) times daily. 02/05/14   Historical Provider, MD  divalproex (DEPAKOTE ER) 500 MG 24 hr tablet Take 1,000 mg by mouth daily. 02/05/14   Historical Provider, MD  escitalopram (LEXAPRO) 10 MG tablet Take 2 tablets by mouth daily. 02/05/14   Historical Provider, MD  gabapentin (NEURONTIN) 300 MG capsule Take 300 mg by mouth 3 (three) times daily. 1 capsule in the morning, 1 capsule in the afternoon, and 2 capsules at bedtime.    Historical Provider, MD  HYDROcodone-acetaminophen (NORCO/VICODIN) 5-325 MG per tablet Take 2 tablets by mouth every 4 (four) hours as needed. 02/24/14   Glynn OctaveStephen Rancour, MD  ibuprofen (ADVIL,MOTRIN) 800 MG tablet Take 1 tablet (800 mg total) by mouth 3 (three) times daily. 02/24/14   Glynn OctaveStephen Rancour, MD  Ipratropium-Albuterol (COMBIVENT IN) Inhale 2 puffs into the lungs daily.     Historical Provider, MD  ketorolac (TORADOL) 10 MG tablet Take 1 tablet (10 mg total) by mouth every 8 (eight) hours as needed. Patient not taking: Reported on 02/24/2014 08/18/13   Lazaro ArmsLuther H  Eure, MD  ondansetron (ZOFRAN) 8 MG tablet Take 1 tablet (8 mg total) by mouth every 8 (eight) hours as needed for nausea. Patient not taking: Reported on 02/24/2014 08/18/13   Lazaro ArmsLuther H Eure, MD  oxybutynin (DITROPAN) 5 MG tablet Take 1 tablet by mouth 2 (two) times daily. 02/05/14   Historical Provider, MD  oxyCODONE-acetaminophen (PERCOCET) 7.5-325 MG per tablet Take 1-2 tablets by mouth every 6 (six) hours as needed for pain. Patient not taking: Reported on 02/24/2014 08/26/13   Lazaro ArmsLuther H Eure, MD  polyethylene glycol powder (GLYCOLAX/MIRALAX) powder Take 17 g by mouth daily as needed for mild constipation or moderate constipation.  02/05/14   Historical Provider, MD  promethazine (PHENERGAN) 25 MG tablet Take 1 tablet (25 mg total) by mouth every 6 (six) hours as needed for nausea or vomiting. Patient not taking: Reported on 02/24/2014 08/18/13   Lazaro ArmsLuther H Eure, MD  QUEtiapine (SEROQUEL) 300 MG tablet Take 600 mg by mouth at bedtime.     Historical Provider, MD  QUEtiapine (SEROQUEL) 400 MG tablet Take 200 mg by mouth 2 (two) times daily.     Historical Provider, MD  SUMAtriptan Succinate (IMITREX PO) Take 2 tablets by mouth daily as needed (migraine).     Historical Provider, MD  SYMBICORT 80-4.5 MCG/ACT inhaler Inhale 2 puffs into the lungs 2 (two) times daily.  05/17/13   Historical Provider, MD  traZODone (DESYREL) 100 MG tablet Take 300 mg by mouth at bedtime.     Historical  Provider, MD      EPWORTH SLEEPINESS SCALE: 10.   BMI: 37.   ARCHITECTURAL SUMMARY: Total recording time was 397 minutes. Sleep efficiency 92 %. Sleep latency 12 minutes. REM latency 2:15 minutes. Stage NI 3 %, N2 61 % and N3 20 % and REM sleep 15 %.    RESPIRATORY DATA:  Baseline oxygen saturation is 94 %. The lowest saturation is 88 %. The diagnostic AHI is 1. The RDI is 1. The REM AHI is 2.  LIMB MOVEMENT SUMMARY: PLM index 0.   ELECTROCARDIOGRAM SUMMARY: Average heart rate is 78 with no significant dysrhythmias  observed.   IMPRESSION:  1. Unremarkable nocturnal polysomnography.  Thanks for this referral.  Abbiegail Landgren A. Gerilyn Pilgrim, M.D. Diplomat, Biomedical engineer of Sleep Medicine.

## 2016-08-17 ENCOUNTER — Emergency Department (HOSPITAL_COMMUNITY): Payer: Medicare Other

## 2016-08-17 ENCOUNTER — Encounter (HOSPITAL_COMMUNITY): Payer: Self-pay | Admitting: Emergency Medicine

## 2016-08-17 ENCOUNTER — Emergency Department (HOSPITAL_COMMUNITY)
Admission: EM | Admit: 2016-08-17 | Discharge: 2016-08-17 | Disposition: A | Discharge status: Home Health | Payer: Medicare Other | Attending: Emergency Medicine | Admitting: Emergency Medicine

## 2016-08-17 DIAGNOSIS — M25571 Pain in right ankle and joints of right foot: Secondary | ICD-10-CM | POA: Diagnosis not present

## 2016-08-17 DIAGNOSIS — M79671 Pain in right foot: Secondary | ICD-10-CM | POA: Insufficient documentation

## 2016-08-17 DIAGNOSIS — F17210 Nicotine dependence, cigarettes, uncomplicated: Secondary | ICD-10-CM | POA: Diagnosis not present

## 2016-08-17 DIAGNOSIS — Y929 Unspecified place or not applicable: Secondary | ICD-10-CM | POA: Diagnosis not present

## 2016-08-17 DIAGNOSIS — J45909 Unspecified asthma, uncomplicated: Secondary | ICD-10-CM | POA: Diagnosis not present

## 2016-08-17 DIAGNOSIS — Y939 Activity, unspecified: Secondary | ICD-10-CM | POA: Insufficient documentation

## 2016-08-17 DIAGNOSIS — Y999 Unspecified external cause status: Secondary | ICD-10-CM | POA: Insufficient documentation

## 2016-08-17 DIAGNOSIS — W108XXA Fall (on) (from) other stairs and steps, initial encounter: Secondary | ICD-10-CM | POA: Diagnosis not present

## 2016-08-17 DIAGNOSIS — S99921A Unspecified injury of right foot, initial encounter: Secondary | ICD-10-CM | POA: Diagnosis present

## 2016-08-17 MED ORDER — TRAMADOL HCL 50 MG PO TABS: 50.0000 mg | ORAL_TABLET | Freq: Four times a day (QID) | ORAL | 0 refills | Status: AC | PRN

## 2016-08-17 MED ORDER — DICLOFENAC SODIUM 75 MG PO TBEC: 75.0000 mg | DELAYED_RELEASE_TABLET | Freq: Two times a day (BID) | ORAL | 0 refills | Status: AC

## 2016-08-17 NOTE — Discharge Instructions (Signed)
Elevate and apply ice packs on/off. Wear the brace as needed for support.  Call the foot doctor listed to arrange a follow-up appt.

## 2016-08-17 NOTE — ED Triage Notes (Signed)
PT states she tripped and hit her right foot on her staircase at home about 2 weeks ago. PT states pain worsened to the sole of her foot yesterday evening with no new injury.

## 2016-08-17 NOTE — ED Notes (Signed)
PT called x1 from triage and no answer.

## 2016-08-19 NOTE — ED Provider Notes (Signed)
[facility]-EMERGENCY DEPT Provider Note   CSN: [id number] Arrival date & time: 08/17/16  1256     History   Chief Complaint Chief Complaint  Patient presents with  . Foot Pain    HPI Dominique APPERSON is a 46 y.o. female.  HPI   Dominique Ferguson is a 46 y.o. female who presents to the Emergency Department complaining of right foot pain for 2 weeks.  States that she tripped and fell on her stairs.  Pain to the foot has been persistent, but worsened one day prior to arrival.  Pain associated with weight bearing.  She denies numbness, swelling, discoloration and calf pain  Past Medical History:  Diagnosis Date  . Acne   . Asthma   . Bipolar 1 disorder (HCC)   . Bipolar 1 disorder (HCC)   . Obsessive compulsive disorder   . OCD (obsessive compulsive disorder)     Patient Active Problem List   Diagnosis Date Noted  . S/P tubal ligation 08/26/2013  . Contraception management 07/13/2013  . Headache(784.0) 06/08/2013    Past Surgical History:  Procedure Laterality Date  . LAPAROSCOPIC BILATERAL SALPINGECTOMY Bilateral 08/18/2013   Procedure: LAPAROSCOPIC BILATERAL SALPINGECTOMY;  Surgeon: Lazaro Arms, MD;  Location: [facility];  Service: Gynecology;  Laterality: Bilateral;    OB History    Gravida Para Term Preterm AB Living   5 2 2   2 2    SAB TAB Ectopic Multiple Live Births     2             Home Medications    Prior to Admission medications   Medication Sig Start Date End Date Taking? Authorizing Provider  albuterol (PROVENTIL HFA;VENTOLIN HFA) 108 (90 BASE) MCG/ACT inhaler Inhale 2 puffs into the lungs every 6 (six) hours as needed for wheezing or shortness of breath.    [provider]  clonazePAM (KLONOPIN) 1 MG tablet Take 1 mg by mouth 4 (four) times daily.    [provider]  COMBIVENT RESPIMAT 20-100 MCG/ACT AERS respimat Inhale 1 puff into the lungs 4 (four) times daily. 02/05/14   [provider]  diclofenac (VOLTAREN) 75 MG EC  tablet Take 1 tablet (75 mg total) by mouth 2 (two) times daily. Take with food 08/17/16   Brecklyn Galvis, PA-C  divalproex (DEPAKOTE ER) 500 MG 24 hr tablet Take 1,000 mg by mouth daily. 02/05/14   [provider]  escitalopram (LEXAPRO) 10 MG tablet Take 2 tablets by mouth daily. 02/05/14   [provider]  gabapentin (NEURONTIN) 300 MG capsule Take 300 mg by mouth 3 (three) times daily. 1 capsule in the morning, 1 capsule in the afternoon, and 2 capsules at bedtime.    [provider]  HYDROcodone-acetaminophen (NORCO/VICODIN) 5-325 MG per tablet Take 2 tablets by mouth every 4 (four) hours as needed. 02/24/14   Rancour, Jeannett Senior, MD  ibuprofen (ADVIL,MOTRIN) 800 MG tablet Take 1 tablet (800 mg total) by mouth 3 (three) times daily. 02/24/14   Rancour, Jeannett Senior, MD  Ipratropium-Albuterol (COMBIVENT IN) Inhale 2 puffs into the lungs daily.     [provider]  ondansetron (ZOFRAN) 8 MG tablet Take 1 tablet (8 mg total) by mouth every 8 (eight) hours as needed for nausea. Patient not taking: Reported on 02/24/2014 08/18/13   Lazaro Arms, MD  oxybutynin (DITROPAN) 5 MG tablet Take 1 tablet by mouth 2 (two) times daily. 02/05/14   [provider]  polyethylene glycol powder (GLYCOLAX/MIRALAX) powder Take 17  g by mouth daily as needed for mild constipation or moderate constipation.  02/05/14   [provider]  promethazine (PHENERGAN) 25 MG tablet Take 1 tablet (25 mg total) by mouth every 6 (six) hours as needed for nausea or vomiting. Patient not taking: Reported on 02/24/2014 08/18/13   Lazaro Arms, MD  QUEtiapine (SEROQUEL) 300 MG tablet Take 600 mg by mouth at bedtime.     [provider]  QUEtiapine (SEROQUEL) 400 MG tablet Take 200 mg by mouth 2 (two) times daily.     [provider]  SUMAtriptan Succinate (IMITREX PO) Take 2 tablets by mouth daily as needed (migraine).     [provider]  SYMBICORT 80-4.5  MCG/ACT inhaler Inhale 2 puffs into the lungs 2 (two) times daily.  05/17/13   [provider]  traMADol (ULTRAM) 50 MG tablet Take 1 tablet (50 mg total) by mouth every 6 (six) hours as needed. 08/17/16   Rhiann Boucher, PA-C  traZODone (DESYREL) 100 MG tablet Take 300 mg by mouth at bedtime.     [provider]    Family History Family History  Problem Relation Age of Onset  . Hypertension Father   . Heart disease Other   . Cancer Other   . Seizures Other   . Mental illness Other   . Diabetes Other        grandfather  . Congestive Heart Failure Unknown        grandmother    Social History Social History  Substance Use Topics  . Smoking status: Current Some Day Smoker    Packs/day: 1.00    Types: Cigarettes  . Smokeless tobacco: Never Used  . Alcohol use No     Allergies   Patient has no known allergies.   Review of Systems Review of Systems  Constitutional: Negative for chills and fever.  Musculoskeletal: Positive for arthralgias (right ankle and foot pain). Negative for joint swelling.  Skin: Negative for color change and wound.  Neurological: Negative for weakness and numbness.  All other systems reviewed and are negative.    Physical Exam Updated Vital Signs BP (!) 156/94 (BP Location: Right Arm)   Pulse (!) 108   Temp 97.7 F (36.5 C) (Oral)   Resp 18   Ht 5\' 3"  (1.6 m)   Wt 107 kg (236 lb)   LMP 08/10/2016   SpO2 96%   BMI 41.81 kg/m   Physical Exam  Constitutional: She is oriented to person, place, and time. She appears well-developed and well-nourished. No distress.  HENT:  Head: Normocephalic and atraumatic.  Cardiovascular: Normal rate, regular rhythm, normal heart sounds and intact distal pulses.   Pulmonary/Chest: Effort normal and breath sounds normal.  Musculoskeletal: She exhibits tenderness. She exhibits no edema.  ttp of the lateral right ankle and lateral foot.  No bony deformity.    No erythema, bruising or  significant edema.  No proximal tenderness.  Neurological: She is alert and oriented to person, place, and time. She exhibits normal muscle tone. Coordination normal.  Skin: Skin is warm and dry.  Nursing note and vitals reviewed.    ED Treatments / Results  Labs (all labs ordered are listed, but only abnormal results are displayed) Labs Reviewed - No data to display  EKG  EKG Interpretation None       Radiology Dg Ankle Complete Right  Result Date: 08/17/2016 CLINICAL DATA:  Right foot and ankle pain, recent fall, tripping injury EXAM: RIGHT ANKLE -  COMPLETE 3+ VIEW COMPARISON:  08/17/2016 FINDINGS: There is no evidence of fracture, dislocation, or joint effusion. There is no evidence of arthropathy or other focal bone abnormality. Soft tissues are unremarkable. IMPRESSION: Negative. Electronically Signed   By: Judie PetitM.  Shick M.D.   On: 08/17/2016 14:02   Dg Foot Complete Right  Result Date: 08/17/2016 CLINICAL DATA:  Recent tripping injury, persistent pain EXAM: RIGHT FOOT COMPLETE - 3+ VIEW COMPARISON:  08/17/2016 FINDINGS: There is no evidence of fracture or dislocation. There is no evidence of arthropathy or other focal bone abnormality. Soft tissues are unremarkable. IMPRESSION: Negative. Electronically Signed   By: Judie PetitM.  Shick M.D.   On: 08/17/2016 14:01     Procedures Procedures (including critical care time)  Medications Ordered in ED Medications - No data to display   Initial Impression / Assessment and Plan / ED Course  I have reviewed the triage vital signs and the nursing notes.  Pertinent labs & imaging results that were available during my care of the patient were reviewed by me and considered in my medical decision making (see chart for details).     Pt NV intact.  XR reassuring.  Compartments are soft.    ASO applied.  Pt agrees to RICE therapy.  Orthopedic f/u in one week if not improving.  Final Clinical Impressions(s) / ED Diagnoses   Final diagnoses:    Foot pain, right  Acute right ankle pain    New Prescriptions Discharge Medication List as of 08/17/2016  2:21 PM    START taking these medications   Details  diclofenac (VOLTAREN) 75 MG EC tablet Take 1 tablet (75 mg total) by mouth 2 (two) times daily. Take with food, Starting Sat 08/17/2016, Print    traMADol (ULTRAM) 50 MG tablet Take 1 tablet (50 mg total) by mouth every 6 (six) hours as needed., Starting Sat 08/17/2016, Print         Pauline Ausriplett, Cimberly Stoffel, PA-C 08/19/16 2040    Vanetta MuldersZackowski, Scott, MD 08/23/16 1715

## 2016-08-20 ENCOUNTER — Other Ambulatory Visit (HOSPITAL_COMMUNITY): Payer: Self-pay | Admitting: Respiratory Therapy

## 2016-08-20 DIAGNOSIS — J441 Chronic obstructive pulmonary disease with (acute) exacerbation: Secondary | ICD-10-CM

## 2016-09-04 ENCOUNTER — Ambulatory Visit (HOSPITAL_COMMUNITY)
Admission: RE | Admit: 2016-09-04 | Discharge: 2016-09-04 | Disposition: A | Discharge status: Home / Self Care | Payer: Medicare Other | Source: Ambulatory Visit | Attending: Pulmonary Disease | Admitting: Pulmonary Disease

## 2016-09-04 DIAGNOSIS — J441 Chronic obstructive pulmonary disease with (acute) exacerbation: Secondary | ICD-10-CM | POA: Insufficient documentation

## 2016-09-04 MED ORDER — ALBUTEROL SULFATE (2.5 MG/3ML) 0.083% IN NEBU
2.5000 mg | INHALATION_SOLUTION | Freq: Once | RESPIRATORY_TRACT | Status: AC
  Administered 2016-09-04: 2.5 mg via RESPIRATORY_TRACT

## 2016-09-05 LAB — PULMONARY FUNCTION TEST
DL/VA % PRED: 89 %
DL/VA: 4.3 ml/min/mmHg/L
DLCO COR % PRED: 93 %
DLCO cor: 22.76 ml/min/mmHg
DLCO unc % pred: 93 %
DLCO unc: 22.76 ml/min/mmHg
FEF 25-75 POST: 3.26 L/s
FEF 25-75 Pre: 3.56 L/sec
FEF2575-%CHANGE-POST: -8 %
FEF2575-%PRED-POST: 109 %
FEF2575-%Pred-Pre: 119 %
FEV1-%Change-Post: -3 %
FEV1-%Pred-Post: 94 %
FEV1-%Pred-Pre: 98 %
FEV1-Post: 2.79 L
FEV1-Pre: 2.88 L
FEV1FVC-%CHANGE-POST: 1 %
FEV1FVC-%Pred-Pre: 106 %
FEV6-%Change-Post: -5 %
FEV6-%PRED-PRE: 93 %
FEV6-%Pred-Post: 88 %
FEV6-Post: 3.18 L
FEV6-Pre: 3.34 L
FEV6FVC-%Pred-Post: 102 %
FEV6FVC-%Pred-Pre: 102 %
FVC-%Change-Post: -4 %
FVC-%PRED-POST: 87 %
FVC-%Pred-Pre: 91 %
FVC-POST: 3.18 L
FVC-Pre: 3.34 L
POST FEV1/FVC RATIO: 88 %
Post FEV6/FVC ratio: 100 %
Pre FEV1/FVC ratio: 86 %
Pre FEV6/FVC Ratio: 100 %
RV % pred: 71 %
RV: 1.19 L
TLC % pred: 89 %
TLC: 4.53 L

## 2019-05-23 IMAGING — DX DG ANKLE COMPLETE 3+V*R*
3 series · 3 of 3 positions shown · non-contrast
Comparison: 08/17/2016

CLINICAL DATA: Right foot and ankle pain, recent fall, tripping
injury

EXAM:
RIGHT ANKLE - COMPLETE 3+ VIEW

[ankle ap]
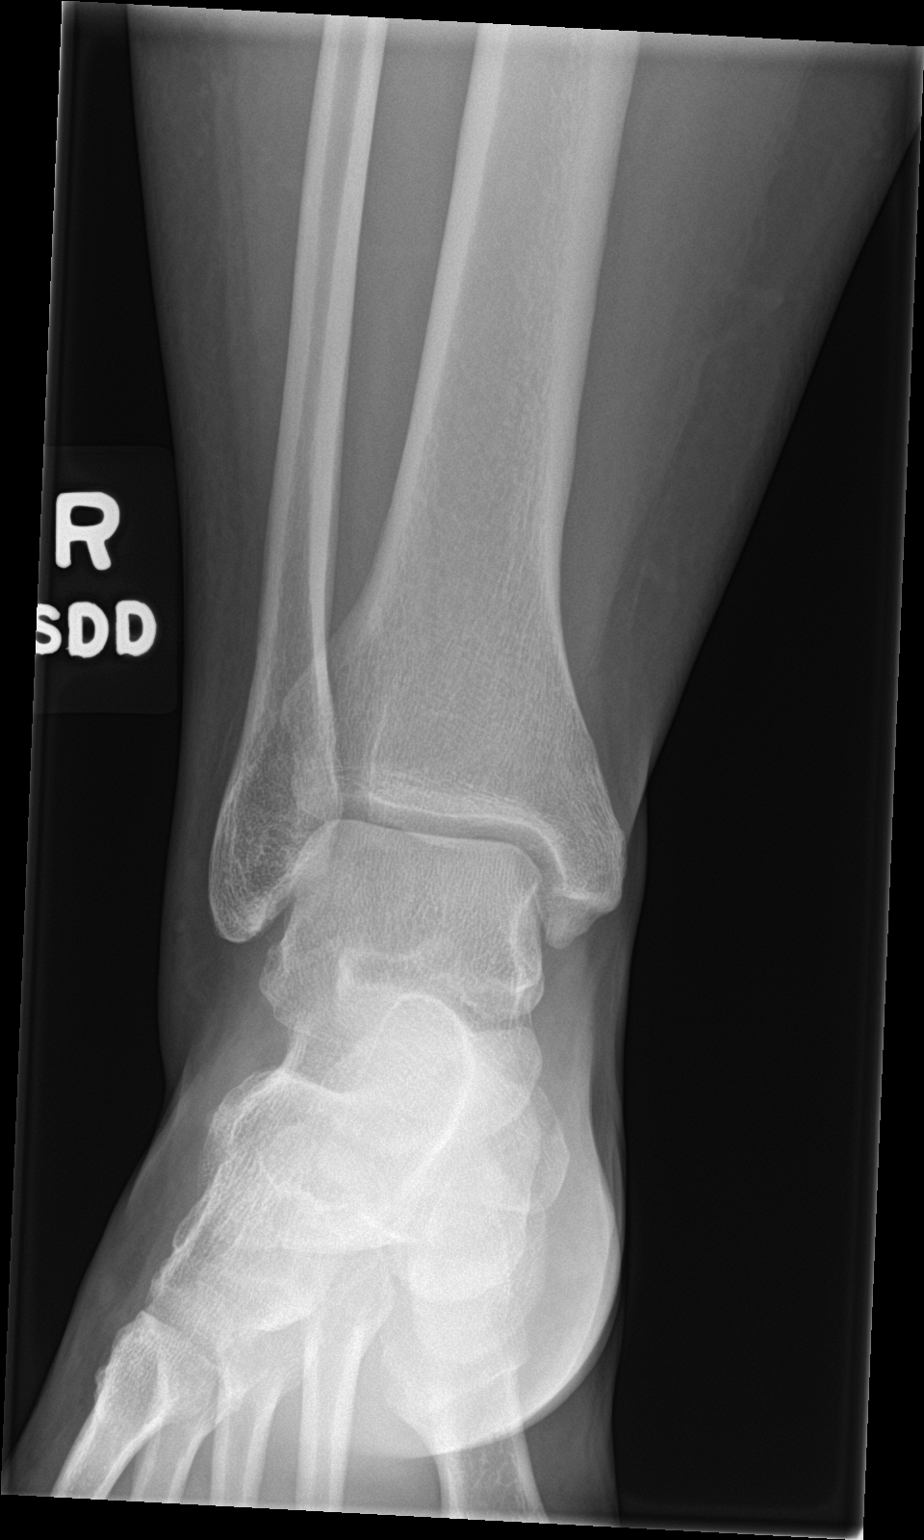

[ankle obl]
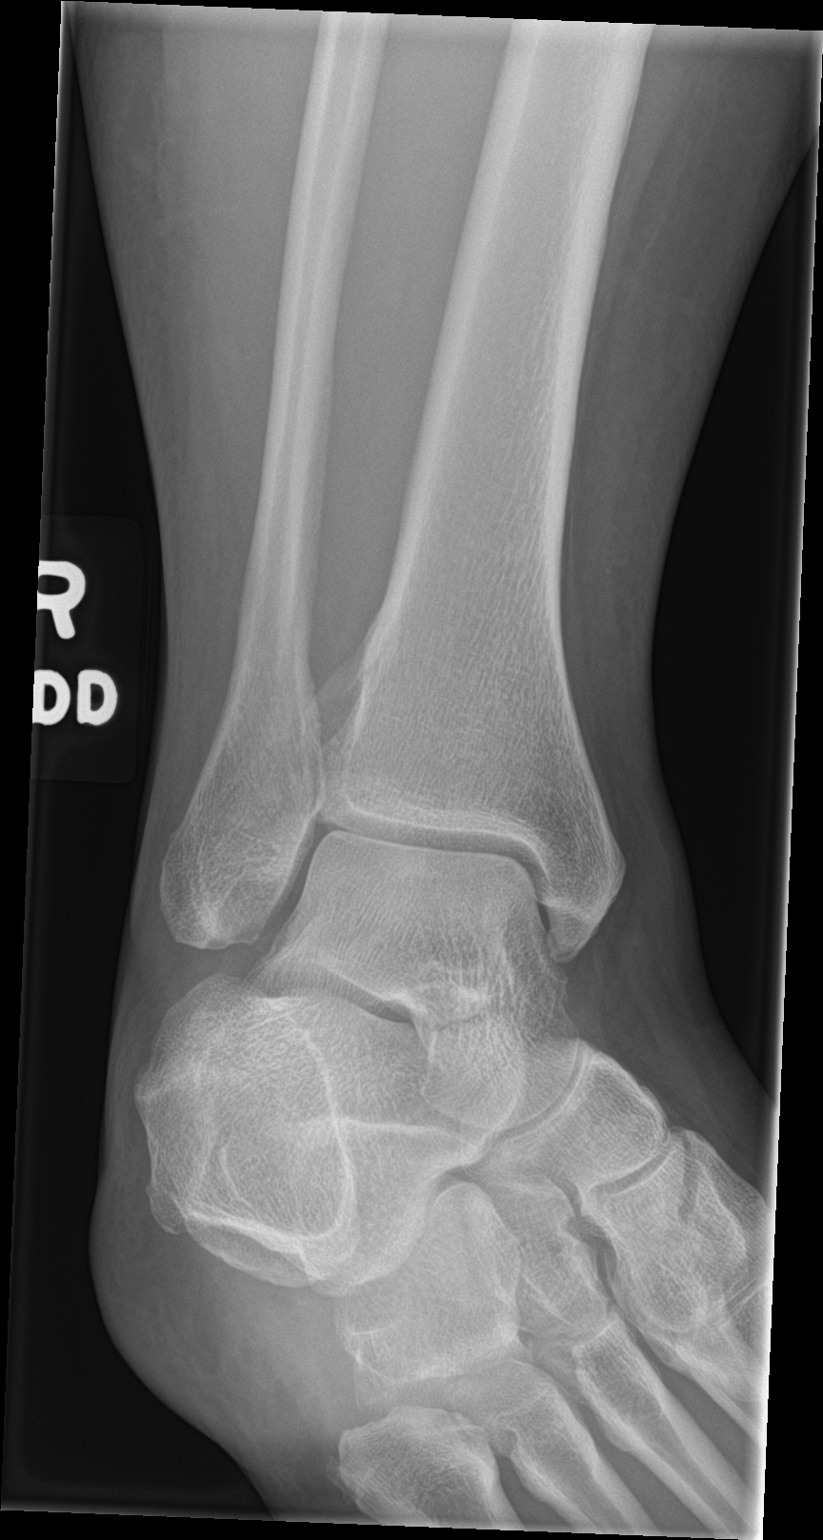

[ankle lat]
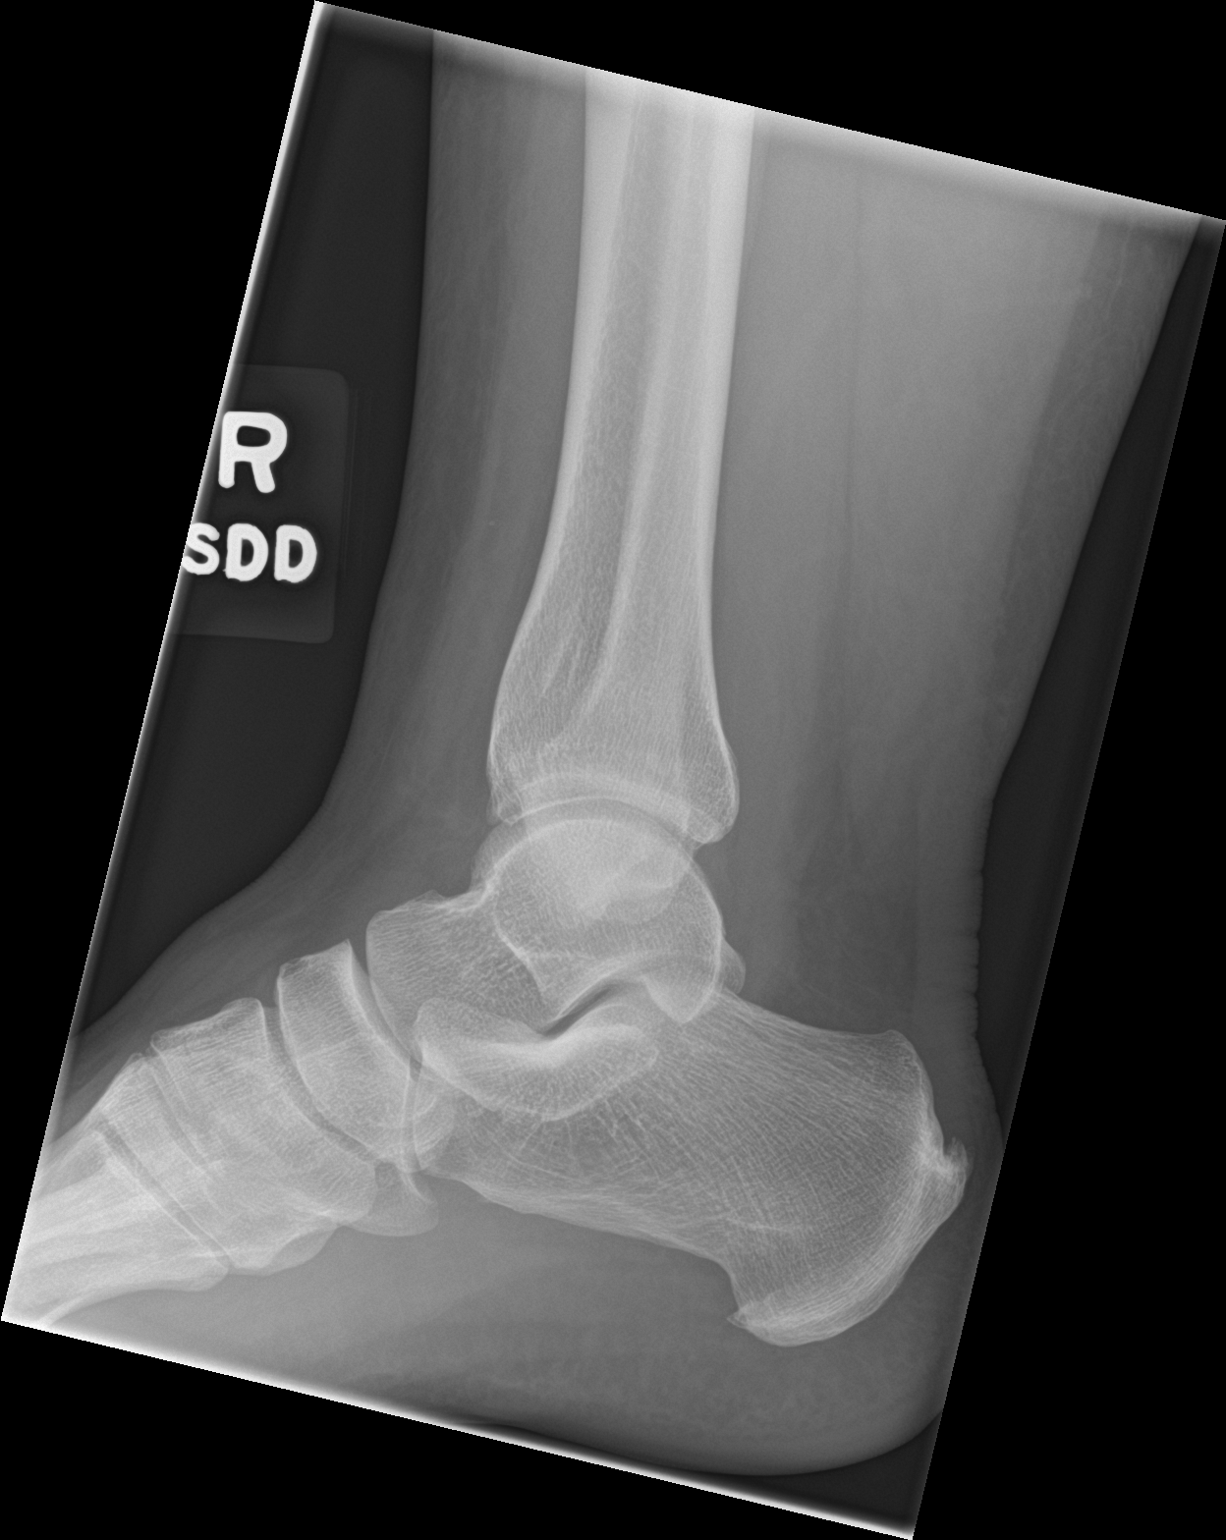

[3 of 3 positions shown; findings below may reference images not displayed]

FINDINGS: There is no evidence of fracture, dislocation, or joint effusion.
There is no evidence of arthropathy or other focal bone abnormality.
Soft tissues are unremarkable.
IMPRESSION: Negative.

## 2023-09-12 ENCOUNTER — Encounter: Payer: Self-pay | Admitting: Advanced Practice Midwife
